# Patient Record
Sex: Male | Born: 1955 | Hispanic: No | Marital: Married | Smoking: Current every day smoker
Health system: Southern US, Community
[De-identification: ages and names within clinical notes are randomized; demographics above are authoritative.]

## PROBLEM LIST (undated history)

## (undated) DIAGNOSIS — F32A Depression, unspecified: Secondary | ICD-10-CM

## (undated) DIAGNOSIS — F419 Anxiety disorder, unspecified: Secondary | ICD-10-CM

## (undated) DIAGNOSIS — E785 Hyperlipidemia, unspecified: Secondary | ICD-10-CM

## (undated) DIAGNOSIS — J45909 Unspecified asthma, uncomplicated: Secondary | ICD-10-CM

## (undated) DIAGNOSIS — F329 Major depressive disorder, single episode, unspecified: Secondary | ICD-10-CM

## (undated) DIAGNOSIS — J189 Pneumonia, unspecified organism: Secondary | ICD-10-CM

## (undated) DIAGNOSIS — F319 Bipolar disorder, unspecified: Secondary | ICD-10-CM

## (undated) DIAGNOSIS — N529 Male erectile dysfunction, unspecified: Secondary | ICD-10-CM

## (undated) DIAGNOSIS — F1010 Alcohol abuse, uncomplicated: Secondary | ICD-10-CM

## (undated) HISTORY — DX: Hyperlipidemia, unspecified: E78.5

## (undated) HISTORY — DX: Alcohol abuse, uncomplicated: F10.10

## (undated) HISTORY — DX: Depression, unspecified: F32.A

## (undated) HISTORY — DX: Major depressive disorder, single episode, unspecified: F32.9

## (undated) HISTORY — DX: Unspecified asthma, uncomplicated: J45.909

## (undated) HISTORY — DX: Anxiety disorder, unspecified: F41.9

## (undated) HISTORY — DX: Male erectile dysfunction, unspecified: N52.9

## (undated) HISTORY — DX: Bipolar disorder, unspecified: F31.9

## (undated) HISTORY — PX: TONSILLECTOMY: SUR1361

## (undated) HISTORY — PX: COLONOSCOPY: SHX174

## (undated) HISTORY — DX: Pneumonia, unspecified organism: J18.9

---

## 2010-03-08 ENCOUNTER — Encounter: Payer: Self-pay | Admitting: Internal Medicine

## 2010-03-16 ENCOUNTER — Ambulatory Visit
Admission: RE | Admit: 2010-03-16 | Discharge: 2010-03-16 | Payer: Self-pay | Source: Home / Self Care | Attending: Internal Medicine | Admitting: Internal Medicine

## 2010-03-16 DIAGNOSIS — J449 Chronic obstructive pulmonary disease, unspecified: Secondary | ICD-10-CM | POA: Insufficient documentation

## 2010-03-16 DIAGNOSIS — F17210 Nicotine dependence, cigarettes, uncomplicated: Secondary | ICD-10-CM | POA: Insufficient documentation

## 2010-04-07 NOTE — Assessment & Plan Note (Signed)
Summary: Pulmonary/ new pt eval with cb/copd 75% HFA    CC:  Pulmonary Consult, increase sob worse with exertion , prod cough green, and symptons worse over the past month.  History of Present Illness: 55 yowm active smoker with new onset chronic cough / sob x 2009    March 16, 2010  1st pulmonary office eval for acute on chronic cough and sob went to [facility]  03/08/10 with cxr and rx with doxy and neb some better with wife's symbicort last good day all the way back in late November  with doe x trash can to road,   does ok flat surface ,  does ok on steps if he takes his time but can't get in a rush. Assoc with nasal congestion and mild chest tightness with cough, better with inhalers   Pt denies any significant sore throat, dysphagia, itching, sneezing,  nasal congestion or excess secretions,  fever, chills, sweats, unintended wt loss, typical pleuritic or exertional cp, hempoptysis, change in activity tolerance  orthopnea pnd or leg swelling Pt also denies any obvious fluctuation in symptoms with weather or environmental change or other alleviating or aggravating factors.       Preventive Screening-Counseling & Management  Alcohol-Tobacco     Smoking Status: current     Packs/Day: 1.5     Year Started: 1961  Current Medications (verified): 1)  Lamotrigine 25 Mg Tabs (Lamotrigine) .Marland Kitchen.. 1 Once Daily 2)  Doxycycline Hyclate 100 Mg Caps (Doxycycline Hyclate) .Marland Kitchen.. 1 Twice A Day  Allergies (verified): 1)  ! 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  The patient denies non-productive cough, coughing up blood, irregular heartbeats, acid heartburn, indigestion, loss of appetite, weight change, abdominal pain, difficulty swallowing, sore throat, itching, ear ache, hand/feet swelling, rash, and fever.    Vital Signs:  Patient profile:   55 year old male Height:      69 inches Weight:      179.2 pounds BMI:     26.56 O2 Sat:      97 % on Room air Temp:     97 degrees F oral Pulse rate:   96 / minute BP sitting:   120 / 90  (left arm)  Vitals Entered By: Renold Genta RCP, LPN (March 16, 2010 2:13 PM)  O2 Flow:  Room air CC: Pulmonary Consult, increase sob worse with exertion , prod cough green, symptons worse over the past month Comments Medications reviewed with patient Renold Genta RCP, LPN  March 16, 2010 2:22 PM    Physical Exam  Additional Exam:  amb wm with mod congested sounding cough wt 179 March 18, 2010 HEENT mild turbinate edema.  Oropharynx no thrush or excess pnd or cobblestoning.  No JVD or cervical adenopathy. Mild accessory muscle hypertrophy. Trachea midline, nl thryroid. Chest was hyperinflated by percussion with diminished  breath sounds and moderate increased exp time without wheeze. Hoover sign positive at mid inspiration. Regular rate and rhythm without murmur gallop or rub or increase P2 or edema.  Abd: no hsm, nl excursion. Ext warm without cyanosis or clubbing.     CXR  Procedure date:  03/08/2010  Findings:      No acute intrathroacic abnormality per [facility]  Impression & Recommendations:  Problem # 1:  COPD UNSPECIFIED (ICD-496) relatively mild clinically though still activly smoking    DDX of  difficult airways managment all start with A and  include Adherence, Ace  Inhibitors, Acid Reflux, Active Sinus Disease, Alpha 1 Antitripsin deficiency, Anxiety masquerading as Airways dz,  ABPA,  allergy(esp in young), Aspiration (esp in elderly), Adverse effects of DPI,  Active smokers, plus one B  = Beta blocker use.Marland Kitchen    Active smoking greatest concern here, see #2  Adherence: I spent extra time with the patient today explaining optimal mdi  technique.  This improved from  50-75% effective  Problem # 2:  SMOKER (ICD-305.1)  I emphasized that although we never turn away smokers from the pulmonary clinic, we do ask that they understand that the recommendations that were made won't work nearly as well in the presence of continued cigarette exposure and we may reach a point where we can't help the patient if he/she can't quit smoking.    Medications Added to Medication List This Visit: 1)  Lamotrigine 25 Mg Tabs (Lamotrigine) .Marland Kitchen.. 1 once daily 2)  Doxycycline Hyclate 100 Mg Caps (Doxycycline hyclate) .Marland Kitchen.. 1 twice a day 3)  Symbicort 160-4.5 Mcg/act Aero (Budesonide-formoterol fumarate) .... 2 puffs first thing  in am and 2 puffs again in pm about 12 hours later  Patient Instructions: 1)  Symbicort 160 2 puffs first thing  in am and 2 puffs again in pm about 12 hours later  2)  Stopping smoking is more important aspect of your care 3)  Please schedule a follow-up appointment in 6 weeks, sooner if needed with pfts  Prescriptions: SYMBICORT 160-4.5 MCG/ACT  AERO (BUDESONIDE-FORMOTEROL FUMARATE) 2 puffs first thing  in am and 2 puffs again in pm about 12 hours later  #1 x 11   Entered and Authorized by:   Nyoka Cowden MD   Signed by:   Nyoka Cowden MD on 03/16/2010   Method used:   Electronically to        [facility] [phone]* (retail)       [address]       [place], [place]  [address]       Ph: [phone] or [phone]       Fax: [phone]   RxID:   [id number]   Appended Document: Orders Update    Clinical  Lists Changes  Orders: Added new Service order of New Patient Level V (36644) - Signed Added new Service order of HFA Instruction [phone]) - Signed

## 2010-04-28 ENCOUNTER — Ambulatory Visit: Payer: Self-pay | Admitting: Internal Medicine

## 2010-04-29 ENCOUNTER — Telehealth: Payer: Self-pay | Admitting: Internal Medicine

## 2010-05-03 NOTE — Progress Notes (Signed)
Summary: nos appt  Phone Note Call from Patient   Caller: [email or web address]  Call For: Benna Arno Summary of Call: LMTCB x2 to [facility] nos from 2/23. Initial call taken by: Darletta Moll,  April 29, 2010 2:26 PM

## 2013-04-21 ENCOUNTER — Emergency Department: Payer: Self-pay | Admitting: Emergency Medicine

## 2016-12-22 ENCOUNTER — Other Ambulatory Visit: Payer: Self-pay | Admitting: Nurse Practitioner

## 2016-12-22 ENCOUNTER — Ambulatory Visit
Admission: RE | Admit: 2016-12-22 | Discharge: 2016-12-22 | Disposition: A | Discharge status: Home / Self Care | Payer: 59 | Source: Ambulatory Visit | Attending: Nurse Practitioner | Admitting: Nurse Practitioner

## 2016-12-22 DIAGNOSIS — J449 Chronic obstructive pulmonary disease, unspecified: Secondary | ICD-10-CM

## 2016-12-22 DIAGNOSIS — R05 Cough: Secondary | ICD-10-CM

## 2016-12-22 DIAGNOSIS — R634 Abnormal weight loss: Secondary | ICD-10-CM

## 2016-12-22 DIAGNOSIS — R059 Cough, unspecified: Secondary | ICD-10-CM

## 2017-03-19 ENCOUNTER — Other Ambulatory Visit: Payer: Self-pay

## 2017-03-19 ENCOUNTER — Inpatient Hospital Stay
Admission: EM | Admit: 2017-03-19 | Discharge: 2017-03-21 | DRG: 871 | Disposition: A | Discharge status: Home / Self Care | Payer: 59 | Attending: Internal Medicine | Admitting: Internal Medicine

## 2017-03-19 ENCOUNTER — Encounter: Payer: Self-pay | Admitting: Emergency Medicine

## 2017-03-19 ENCOUNTER — Emergency Department: Payer: 59

## 2017-03-19 DIAGNOSIS — J440 Chronic obstructive pulmonary disease with acute lower respiratory infection: Secondary | ICD-10-CM | POA: Diagnosis present

## 2017-03-19 DIAGNOSIS — F1010 Alcohol abuse, uncomplicated: Secondary | ICD-10-CM | POA: Diagnosis present

## 2017-03-19 DIAGNOSIS — Z79899 Other long term (current) drug therapy: Secondary | ICD-10-CM | POA: Diagnosis not present

## 2017-03-19 DIAGNOSIS — J189 Pneumonia, unspecified organism: Secondary | ICD-10-CM

## 2017-03-19 DIAGNOSIS — F419 Anxiety disorder, unspecified: Secondary | ICD-10-CM | POA: Diagnosis present

## 2017-03-19 DIAGNOSIS — F319 Bipolar disorder, unspecified: Secondary | ICD-10-CM | POA: Diagnosis present

## 2017-03-19 DIAGNOSIS — F17210 Nicotine dependence, cigarettes, uncomplicated: Secondary | ICD-10-CM | POA: Diagnosis present

## 2017-03-19 DIAGNOSIS — R911 Solitary pulmonary nodule: Secondary | ICD-10-CM | POA: Diagnosis present

## 2017-03-19 DIAGNOSIS — E876 Hypokalemia: Secondary | ICD-10-CM | POA: Diagnosis present

## 2017-03-19 DIAGNOSIS — A419 Sepsis, unspecified organism: Principal | ICD-10-CM | POA: Diagnosis present

## 2017-03-19 DIAGNOSIS — E871 Hypo-osmolality and hyponatremia: Secondary | ICD-10-CM | POA: Diagnosis present

## 2017-03-19 DIAGNOSIS — E785 Hyperlipidemia, unspecified: Secondary | ICD-10-CM | POA: Diagnosis present

## 2017-03-19 DIAGNOSIS — J181 Lobar pneumonia, unspecified organism: Secondary | ICD-10-CM | POA: Diagnosis present

## 2017-03-19 LAB — BASIC METABOLIC PANEL
Anion gap: 12 (ref 5–15)
BUN: 16 mg/dL (ref 6–20)
CHLORIDE: 93 mmol/L — AB (ref 101–111)
CO2: 23 mmol/L (ref 22–32)
CREATININE: 1.13 mg/dL (ref 0.61–1.24)
Calcium: 8.5 mg/dL — ABNORMAL LOW (ref 8.9–10.3)
GFR calc Af Amer: >60 mL/min (ref >60)
GFR calc non Af Amer: >60 mL/min (ref >60)
Glucose, Bld: 138 mg/dL — ABNORMAL HIGH (ref 65–99)
Potassium: 3.4 mmol/L — ABNORMAL LOW (ref 3.5–5.1)
SODIUM: 128 mmol/L — AB (ref 135–145)

## 2017-03-19 LAB — CBC
HCT: 37.7 % — ABNORMAL LOW (ref 40.0–52.0)
Hemoglobin: 13.1 g/dL (ref 13.0–18.0)
MCH: 32.8 pg (ref 26.0–34.0)
MCHC: 34.8 g/dL (ref 32.0–36.0)
MCV: 94.2 fL (ref 80.0–100.0)
PLATELETS: 243 10*3/uL (ref 150–440)
RBC: 4 MIL/uL — ABNORMAL LOW (ref 4.40–5.90)
RDW: 14.1 % (ref 11.5–14.5)
WBC: 11.3 10*3/uL — ABNORMAL HIGH (ref 3.8–10.6)

## 2017-03-19 LAB — LACTIC ACID, PLASMA
LACTIC ACID, VENOUS: 1.7 mmol/L (ref 0.5–1.9)
LACTIC ACID, VENOUS: 1.7 mmol/L (ref 0.5–1.9)
LACTIC ACID, VENOUS: 1.2 mmol/L (ref 0.5–1.9)

## 2017-03-19 LAB — PROTIME-INR
INR: 1.29
Prothrombin Time: 16 seconds — ABNORMAL HIGH (ref 11.4–15.2)

## 2017-03-19 LAB — APTT: aPTT: 42 seconds — ABNORMAL HIGH (ref 24–36)

## 2017-03-19 LAB — MAGNESIUM: MAGNESIUM: 2.2 mg/dL (ref 1.7–2.4)

## 2017-03-19 LAB — INFLUENZA PANEL BY PCR (TYPE A & B)
Influenza A By PCR: NEGATIVE
Influenza B By PCR: NEGATIVE

## 2017-03-19 LAB — PROCALCITONIN: PROCALCITONIN: 6.14 ng/mL

## 2017-03-19 LAB — TROPONIN I: Troponin I: <0.03 ng/mL (ref <0.03)

## 2017-03-19 MED ORDER — MORPHINE SULFATE (PF) 4 MG/ML IV SOLN
4.0000 mg | Freq: Once | INTRAVENOUS | Status: AC
  Administered 2017-03-19: 4 mg via INTRAVENOUS
  Filled 2017-03-19: qty 1

## 2017-03-19 MED ORDER — LORAZEPAM 2 MG/ML IJ SOLN: 1.0000 mg | Freq: Four times a day (QID) | INTRAMUSCULAR | Status: DC | PRN

## 2017-03-19 MED ORDER — BISACODYL 5 MG PO TBEC: 5.0000 mg | DELAYED_RELEASE_TABLET | Freq: Every day | ORAL | Status: DC | PRN

## 2017-03-19 MED ORDER — SODIUM CHLORIDE 0.9% FLUSH
3.0000 mL | Freq: Two times a day (BID) | INTRAVENOUS | Status: DC
  Administered 2017-03-19 – 2017-03-21 (×4): 3 mL via INTRAVENOUS

## 2017-03-19 MED ORDER — IOPAMIDOL (ISOVUE-370) INJECTION 76%
75.0000 mL | Freq: Once | INTRAVENOUS | Status: AC | PRN
  Administered 2017-03-19: 75 mL via INTRAVENOUS

## 2017-03-19 MED ORDER — ALBUTEROL SULFATE (2.5 MG/3ML) 0.083% IN NEBU
2.5000 mg | INHALATION_SOLUTION | Freq: Once | RESPIRATORY_TRACT | Status: AC
  Administered 2017-03-19: 2.5 mg via RESPIRATORY_TRACT
  Filled 2017-03-19: qty 3

## 2017-03-19 MED ORDER — SODIUM CHLORIDE 0.9 % IV BOLUS (SEPSIS)
1000.0000 mL | Freq: Once | INTRAVENOUS | Status: AC
  Administered 2017-03-19: 1000 mL via INTRAVENOUS

## 2017-03-19 MED ORDER — ENOXAPARIN SODIUM 40 MG/0.4ML ~~LOC~~ SOLN
40.0000 mg | SUBCUTANEOUS | Status: DC
  Administered 2017-03-19 – 2017-03-20 (×2): 40 mg via SUBCUTANEOUS
  Filled 2017-03-19 (×2): qty 0.4

## 2017-03-19 MED ORDER — SODIUM CHLORIDE 0.9% FLUSH: 3.0000 mL | INTRAVENOUS | Status: DC | PRN

## 2017-03-19 MED ORDER — LAMOTRIGINE 100 MG PO TABS
100.0000 mg | ORAL_TABLET | Freq: Every day | ORAL | Status: DC
  Administered 2017-03-19 – 2017-03-20 (×2): 100 mg via ORAL
  Filled 2017-03-19 (×2): qty 1

## 2017-03-19 MED ORDER — DEXTROSE 5 % IV SOLN
500.0000 mg | Freq: Every day | INTRAVENOUS | Status: DC
  Filled 2017-03-19: qty 500

## 2017-03-19 MED ORDER — DEXTROSE 5 % IV SOLN
1.0000 g | Freq: Every day | INTRAVENOUS | Status: DC
  Filled 2017-03-19: qty 10

## 2017-03-19 MED ORDER — ONDANSETRON HCL 4 MG/2ML IJ SOLN: 4.0000 mg | Freq: Four times a day (QID) | INTRAMUSCULAR | Status: DC | PRN

## 2017-03-19 MED ORDER — POTASSIUM CHLORIDE CRYS ER 20 MEQ PO TBCR
40.0000 meq | EXTENDED_RELEASE_TABLET | Freq: Once | ORAL | Status: AC
  Administered 2017-03-19: 15:00:00 40 meq via ORAL
  Filled 2017-03-19: qty 2

## 2017-03-19 MED ORDER — SODIUM CHLORIDE 0.9 % IV SOLN: 250.0000 mL | INTRAVENOUS | Status: DC | PRN

## 2017-03-19 MED ORDER — VITAMIN B-1 100 MG PO TABS
100.0000 mg | ORAL_TABLET | Freq: Every day | ORAL | Status: DC
  Administered 2017-03-19 – 2017-03-20 (×2): 100 mg via ORAL
  Filled 2017-03-19 (×2): qty 1

## 2017-03-19 MED ORDER — ALBUTEROL SULFATE (2.5 MG/3ML) 0.083% IN NEBU: 2.5000 mg | INHALATION_SOLUTION | RESPIRATORY_TRACT | Status: DC | PRN

## 2017-03-19 MED ORDER — ACETAMINOPHEN 650 MG RE SUPP: 650.0000 mg | Freq: Four times a day (QID) | RECTAL | Status: DC | PRN

## 2017-03-19 MED ORDER — CEFTRIAXONE SODIUM IN DEXTROSE 20 MG/ML IV SOLN
1.0000 g | Freq: Once | INTRAVENOUS | Status: AC
  Administered 2017-03-19: 1 g via INTRAVENOUS
  Filled 2017-03-19: qty 50

## 2017-03-19 MED ORDER — FOLIC ACID 1 MG PO TABS
1000.0000 ug | ORAL_TABLET | Freq: Every day | ORAL | Status: DC
  Administered 2017-03-19 – 2017-03-20 (×2): 1 mg via ORAL
  Filled 2017-03-19 (×2): qty 1

## 2017-03-19 MED ORDER — ONDANSETRON HCL 4 MG/2ML IJ SOLN
4.0000 mg | Freq: Once | INTRAMUSCULAR | Status: AC
  Administered 2017-03-19: 4 mg via INTRAVENOUS
  Filled 2017-03-19: qty 2

## 2017-03-19 MED ORDER — SENNOSIDES-DOCUSATE SODIUM 8.6-50 MG PO TABS: 1.0000 | ORAL_TABLET | Freq: Every evening | ORAL | Status: DC | PRN

## 2017-03-19 MED ORDER — ACETAMINOPHEN 325 MG PO TABS: 650.0000 mg | ORAL_TABLET | Freq: Four times a day (QID) | ORAL | Status: DC | PRN

## 2017-03-19 MED ORDER — GUAIFENESIN 100 MG/5ML PO SOLN
5.0000 mL | ORAL | Status: DC | PRN
  Administered 2017-03-20: 100 mg via ORAL
  Filled 2017-03-19: qty 5
  Filled 2017-03-19 (×2): qty 10
  Filled 2017-03-19: qty 5

## 2017-03-19 MED ORDER — HYDROCODONE-ACETAMINOPHEN 5-325 MG PO TABS
1.0000 | ORAL_TABLET | ORAL | Status: DC | PRN
  Administered 2017-03-19 (×2): 1 via ORAL
  Administered 2017-03-20 (×2): 2 via ORAL
  Filled 2017-03-19: qty 2
  Filled 2017-03-19: qty 1
  Filled 2017-03-19: qty 2
  Filled 2017-03-19: qty 1

## 2017-03-19 MED ORDER — NICOTINE 14 MG/24HR TD PT24: 14.0000 mg | MEDICATED_PATCH | Freq: Every day | TRANSDERMAL | Status: DC

## 2017-03-19 MED ORDER — SODIUM CHLORIDE 0.9 % IV SOLN
INTRAVENOUS | Status: DC
  Administered 2017-03-19 – 2017-03-20 (×2): via INTRAVENOUS

## 2017-03-19 MED ORDER — DEXTROSE 5 % IV SOLN
500.0000 mg | Freq: Once | INTRAVENOUS | Status: AC
  Administered 2017-03-19: 16:00:00 500 mg via INTRAVENOUS
  Filled 2017-03-19: qty 500

## 2017-03-19 MED ORDER — ONDANSETRON HCL 4 MG PO TABS: 4.0000 mg | ORAL_TABLET | Freq: Four times a day (QID) | ORAL | Status: DC | PRN

## 2017-03-19 MED ORDER — LORAZEPAM 1 MG PO TABS
1.0000 mg | ORAL_TABLET | Freq: Four times a day (QID) | ORAL | Status: DC | PRN
  Administered 2017-03-19: 1 mg via ORAL
  Filled 2017-03-19: qty 1

## 2017-03-19 MED ORDER — CEFTRIAXONE SODIUM IN DEXTROSE 20 MG/ML IV SOLN
1.0000 g | Freq: Every day | INTRAVENOUS | Status: DC
  Filled 2017-03-19: qty 50

## 2017-03-19 NOTE — ED Provider Notes (Addendum)
[facility] Emergency Department Provider Note  ____________________________________________   First MD Initiated Contact with Patient 03/19/17 [phone]     (approximate)  I have reviewed the triage vital signs and the nursing notes.   HISTORY  Chief Complaint Shortness of Breath   HPI Drew Lee is a 62 y.o. male with a history of bipolar disorder, COPD who still smokes 1 pack/day who is presenting to the emergency department with 1 week of worsening shortness of breath as well as low thoracic back pain.  He says that he was having intermittent fever night sweats but this has stopped.  However, he has now developed a productive cough and worsening left lower thoracic pain which he says worsens when he moves or when he coughs.  Says the pain is sharp and a 10 out of 10 at this time.   Past Medical History:  Diagnosis Date  . Anxiety   . Asthma   . Bipolar 1 disorder (HCC)   . Depression   . ED (erectile dysfunction)   . ETOH abuse   . Hyperlipidemia     Patient Active Problem List   Diagnosis Date Noted  . SMOKER 03/16/2010  . COPD UNSPECIFIED 03/16/2010    Past Surgical History:  Procedure Laterality Date  . COLONOSCOPY      Prior to Admission medications   Medication Sig Start Date End Date Taking? Authorizing Provider  albuterol (PROVENTIL HFA;VENTOLIN HFA) 108 (90 BASE) MCG/ACT inhaler Inhale 2 puffs into the lungs every 6 (six) hours as needed for wheezing or shortness of breath.    [provider]  lamoTRIgine (LAMICTAL) 100 MG tablet Take 100 mg by mouth daily.    [provider]  Multiple Vitamins-Minerals (MULTIVITAMIN WITH MINERALS) tablet Take 1 tablet by mouth daily.    [provider]  omega-3 acid ethyl esters (LOVAZA) 1 G capsule Take by mouth 2 (two) times daily.    [provider]  Red Yeast Rice 600 MG CAPS Take by mouth.    [provider]    Allergies Magnesium-containing  compounds; Sulfa antibiotics; Sulfonamide derivatives; and Viagra [sildenafil citrate]  Family History  Problem Relation Age of Onset  . Kidney failure Mother   . Heart attack Father     Social History Social History   Tobacco Use  . Smoking status: Current Every Day Smoker  . Smokeless tobacco: Never Used  Substance Use Topics  . Alcohol use: Yes  . Drug use: No    Review of Systems  Constitutional: No fever/chills Eyes: No visual changes. ENT: No sore throat. Cardiovascular: As above Respiratory: As above Gastrointestinal: No abdominal pain.  No nausea, no vomiting.  No diarrhea.  No constipation. Genitourinary: Negative for dysuria. Musculoskeletal: As above  skin: Negative for rash. Neurological: Negative for headaches, focal weakness or numbness.   ____________________________________________   PHYSICAL EXAM:  VITAL SIGNS: ED Triage Vitals  Enc Vitals Group     BP 03/19/17 0804 (!) 123/93     Pulse Rate 03/19/17 0804 (!) 128     Resp 03/19/17 0804 (!) 22     Temp 03/19/17 0804 99.4 F (37.4 C)     Temp Source 03/19/17 0804 Oral     SpO2 03/19/17 0804 95 %     Weight 03/19/17 0806 162 lb (73.5 kg)     Height 03/19/17 0806 5\' 9"  (1.753 m)     Head Circumference --      Peak Flow --  Pain Score 03/19/17 0806 0     Pain Loc --      Pain Edu? --      Excl. in GC? --     Constitutional: Alert and oriented.  Appears uncomfortable, tachypneic. Eyes: Conjunctivae are normal.  Head: Atraumatic. Nose: No congestion/rhinnorhea. Mouth/Throat: Mucous membranes are moist.  Neck: No stridor.   Cardiovascular: Tachycardic, regular rhythm. Grossly normal heart sounds.   Respiratory: Tachypneic and taking shallow breaths but speaking in full sentences.  Lungs are clear throughout without any wheezing.  No tenderness to palpation along the low thoracic back bilaterally. Gastrointestinal: Soft and nontender. No distention.  No CVA tenderness to  palpation. Musculoskeletal: No lower extremity tenderness nor edema.  No joint effusions. Neurologic:  Normal speech and language. No gross focal neurologic deficits are appreciated. Skin:  Skin is warm, dry and intact. No rash noted. Psychiatric: Mood and affect are normal. Speech and behavior are normal.  ____________________________________________   LABS (all labs ordered are listed, but only abnormal results are displayed)  Labs Reviewed  BASIC METABOLIC PANEL - Abnormal; Notable for the following components:      Result Value   Sodium 128 (*)    Potassium 3.4 (*)    Chloride 93 (*)    Glucose, Bld 138 (*)    Calcium 8.5 (*)    All other components within normal limits  CBC - Abnormal; Notable for the following components:   WBC 11.3 (*)    RBC 4.00 (*)    HCT 37.7 (*)    All other components within normal limits  TROPONIN I   ____________________________________________  EKG  ED ECG REPORT I, Arelia Longestavid M Schaevitz, the attending physician, personally viewed and interpreted this ECG.   Date: 03/19/2017  EKG Time: 0811  Rate: 123  Rhythm: sinus tachycardia  Axis: Normal  Intervals:none  ST&T Change: No ST segment elevation or depression.  No abnormal T wave inversion.  ____________________________________________  RADIOLOGY  Chest x-ray suspicious for left upper lobe pneumonia with possible obstruction.  Possible mass.  Recommending CT for further evaluation.  Dense left upper lobe consolidation compatible with pneumonia.  13 mm right upper lobe nodule. ____________________________________________   PROCEDURES  Procedure(s) performed:   Procedures  Critical Care performed:   ____________________________________________   INITIAL IMPRESSION / ASSESSMENT AND PLAN / ED COURSE  Pertinent labs & imaging results that were available during my care of the patient were reviewed by me and considered in my medical decision making (see chart for  details).  Differential includes, but is not limited to, viral syndrome, bronchitis including COPD exacerbation, pneumonia, reactive airway disease including asthma, CHF including exacerbation with or without pulmonary/interstitial edema, pneumothorax, ACS, thoracic trauma, and pulmonary embolism. As part of my medical decision making, I reviewed the following data within the electronic MEDICAL RECORD NUMBER Old chart reviewed   ----------------------------------------- 11:32 AM on 03/19/2017 -----------------------------------------  Patient at this time continues to be tachycardic and tachypneic.  We will start the patient on IV antibiotics.  Sepsis alert called secondary to tachycardia as well as elevated white blood cell count with a known infectious source.  Patient will be admitted to the hospital.  Signed out to Dr. Imogene Burnhen.  The patient is understanding of the plan and willing to comply.  He understands the diagnosis as well.  He is accompanied by his wife requested a work note because she is late to work because she is covering her husband.  This note was given.  Patient says  that his most recent antibiotics were a 5-day course of doxycycline 1 month ago.  He says that he had taken doxycycline initially multiple months ago but then saved the extra 5 tabs after he did not complete the first course.  I will start the patient on community-acquired regimen for his antibiotics as he has not had any recent hospitalizations.  Has not a full course of antibiotics either.     ____________________________________________   FINAL CLINICAL IMPRESSION(S) / ED DIAGNOSES  Lobar pneumonia.    NEW MEDICATIONS STARTED DURING THIS VISIT:  New Prescriptions   No medications on file     Note:  This document was prepared using Dragon voice recognition software and may include unintentional dictation errors.     Myrna Blazer, MD 03/19/17 1133    Schaevitz, Myra Rude, MD 03/19/17  1134    Schaevitz, Myra Rude, MD 03/19/17 1134

## 2017-03-19 NOTE — ED Notes (Signed)
This RN attempted to call report accepting RN with MD at this time. RN will continue monitor.

## 2017-03-19 NOTE — ED Notes (Signed)
CODE  SEPSIS  CALLED  TO  CARELINK 

## 2017-03-19 NOTE — Progress Notes (Signed)
Pharmacy Antibiotic Note  Drew Lee is a 62 y.o. male admitted on 03/19/2017 with pneumonia and sepsis.  Pharmacy has been consulted for ceftriaxone and azithromycin dosing.  Plan: CTX 1 g iv daily and azithromycin 500 mg iv daily.   Height: 5\' 9"  (175.3 cm) Weight: 162 lb (73.5 kg) IBW/kg (Calculated) : 70.7  Temp (24hrs), Avg:99.4 F (37.4 C), Min:99.4 F (37.4 C), Max:99.4 F (37.4 C)  Recent Labs  Lab 03/19/17 0810 03/19/17 1106  WBC 11.3*  --   CREATININE 1.13  --   LATICACIDVEN  --  1.7    Estimated Creatinine Clearance: 68.6 mL/min (by C-G formula based on SCr of 1.13 mg/dL).    Allergies  Allergen Reactions  . Magnesium-Containing Compounds Hives  . Sulfa Antibiotics Hives  . Sulfonamide Derivatives     REACTION: hives  . Viagra [Sildenafil Citrate]     Blurred vision    Antimicrobials this admission: CTX 1/14 >>  Azithromycin 1/14 >>   Dose adjustments this admission:   Microbiology results: 1/14 BCx: sent  Thank you for allowing pharmacy to be a part of this patient's care.  Luisa HartChristy, Katlin Bortner D 03/19/2017 12:16 PM

## 2017-03-19 NOTE — Progress Notes (Signed)
CODE SEPSIS - PHARMACY COMMUNICATION  **Broad Spectrum Antibiotics should be administered within 1 hour of Sepsis diagnosis**  Time Code Sepsis Called/Page Received: 11:37  Antibiotics Ordered: Ceftriaxone and Azithromycin  Time of 1st antibiotic administration: Ceftriaxone 1g administered at 11:28  Additional action taken by pharmacy: none  If necessary, Name of Provider/Nurse Contacted: N/A    Clovia CuffLisa Samanvitha Germany, PharmD, BCPS 03/19/2017 11:53 AM

## 2017-03-19 NOTE — ED Triage Notes (Signed)
Arrives with C/O SOB, body aches, chest pain, not feeling well x 1 week.  Seen by PCP for symptoms on Thursday and told he had a 72 hour virus.  Symptoms not improving.

## 2017-03-19 NOTE — Progress Notes (Signed)
Date: 03/19/2017,   MRN# [id number] AHMOD GILLESPIE 09-23-1955 Code Status:     Code Status Orders  (From admission, onward)        Start     Ordered   03/19/17 1319  Full code  Continuous     03/19/17 1318    Code Status History    Date Active Date Inactive Code Status Order ID Comments User Context   This patient has a current code status but no historical code status.     Hosp day:@LENGTHOFSTAYDAYS @ Referring MD: @ATDPROV @      CC: abnormal chest ct scan  HPI: this is a 62 year old male , came in with cough, sob. On w/u noted to have left pneumonia. admitted on septic protocol. He is a drinker and smoker. He also noted to have 13 mm RUL nodule for which pulmonary was consulted.  He denies hemoptysis. No frank fever or sweats. No known TB exposure.  No hx of aspiration. Marland Kitchen    PMHX:   Past Medical History:  Diagnosis Date  . Anxiety   . Asthma   . Bipolar 1 disorder (HCC)   . Depression   . ED (erectile dysfunction)   . ETOH abuse   . Hyperlipidemia    Surgical Hx:  Past Surgical History:  Procedure Laterality Date  . COLONOSCOPY     Family Hx:  Family History  Problem Relation Age of Onset  . Kidney failure Mother   . Heart attack Father    Social Hx:   Social History   Tobacco Use  . Smoking status: Current Every Day Smoker    Packs/day: 1.00  . Smokeless tobacco: Never Used  . Tobacco comment: refused  Substance Use Topics  . Alcohol use: Yes    Alcohol/week: 2.4 oz    Types: 4 Cans of beer per week    Comment: per pt tues night  . Drug use: No   Medication:    Home Medication:    Current Medication: @CURMEDTAB @   Allergies:  Magnesium-containing compounds; Sulfa antibiotics; Sulfonamide derivatives; and Viagra [sildenafil citrate]  Review of Systems: Gen:  Denies  fever, sweats, chills HEENT: Denies blurred vision, double vision, ear pain, eye pain, hearing loss, nose bleeds, sore throat Cvc:  No dizziness, chest pain or heaviness Resp:  Cough,  son  Gi: Denies swallowing difficulty, stomach pain, nausea or vomiting, diarrhea, constipation, bowel incontinence Gu:  Denies bladder incontinence, burning urine Ext:   No Joint pain, stiffness or swelling Skin: No skin rash, easy bruising or bleeding or hives Endoc:  No polyuria, polydipsia , polyphagia or weight change Psych: No depression, insomnia or hallucinations  Other:  All other systems negative  Physical Examination:   VS: BP (!) 113/91 (BP Location: Left Arm)   Pulse (!) 105   Temp 98.9 F (37.2 C) (Oral)   Resp (!) 26   Ht 5\' 9"  (1.753 m)   Wt 162 lb (73.5 kg)   SpO2 91%   BMI 23.92 kg/m   General Appearance: No distress  Neuro: without focal findings, mental status, speech normal, alert and oriented, cranial nerves 2-12 intact, reflexes normal and symmetric, sensation grossly normal  HEENT: PERRLA, EOM intact, no ptosis, no other lesions noticed NECK: Supple, no stridor Pulmonary:.No wheezing, No rales   Cardiovascular:  Normal S1,S2.  No m/r/g.  Abdominal aorta pulsation normal.    Abdomen:Benign, Soft, non-tender, No masses, hepatosplenomegaly, No lymphadenopathy Endoc: No evident thyromegaly, no signs of acromegaly or Cushing features  Skin:   warm, no rashes, no ecchymosis  Extremities: normal, no cyanosis, clubbing, no edema, warm with normal capillary refill. Other findings:   Labs results:   Recent Labs    03/19/17 0810 03/19/17 1359  HGB 13.1  --   HCT 37.7*  --   MCV 94.2  --   WBC 11.3*  --   BUN 16  --   CREATININE 1.13  --   GLUCOSE 138*  --   CALCIUM 8.5*  --   INR  --  1.29  ,    Rad results:   Dg Chest 2 View  Result Date: 03/19/2017 CLINICAL DATA:  Left-sided chest, back, and rib pain, worse with inspiration. EXAM: CHEST  2 VIEW COMPARISON:  Chest x-ray dated Aug 04, 2011. FINDINGS: The cardiomediastinal silhouette is normal in size. Normal pulmonary vascularity. Consolidation within the left upper lobe. Volume loss in the left  hemithorax with atelectasis in the left lower lobe. The right lung is clear. No pneumothorax or pleural effusion. No acute osseous abnormality. IMPRESSION: Consolidation within the left upper lobe, suspicious for pneumonia. Volume loss within the left hemithorax could reflect a postobstructive component from underlying mass. Recommend CT of the chest, with contrast if possible, for further evaluation. Electronically Signed   By: Obie Dredge M.D.   On: 03/19/2017 08:50   Ct Angio Chest Pe W And/or Wo Contrast  Result Date: 03/19/2017 CLINICAL DATA:  Shortness of breath, body aches EXAM: CT ANGIOGRAPHY CHEST WITH CONTRAST TECHNIQUE: Multidetector CT imaging of the chest was performed using the standard protocol during bolus administration of intravenous contrast. Multiplanar CT image reconstructions and MIPs were obtained to evaluate the vascular anatomy. CONTRAST:  75mL ISOVUE-370 IOPAMIDOL (ISOVUE-370) INJECTION 76% COMPARISON:  None. FINDINGS: Cardiovascular: No filling defects in the pulmonary arteries to suggest pulmonary emboli. Insert Heart Mediastinum/Nodes: Borderline sized mediastinal lymph nodes, likely reactive. Index prevascular lymph node has a short axis diameter of 9 mm. No axillary adenopathy or visible hilar adenopathy. Lungs/Pleura: Dense consolidation noted in the left upper lobe compatible with pneumonia. Moderate underlying centrilobular and paraseptal emphysema. Ground-glass nodular density in the right upper lobe measures 13 mm. Trace left pleural effusion. Upper Abdomen: Imaging into the upper abdomen shows no acute findings. Musculoskeletal: Chest wall soft tissues are unremarkable. No acute bony abnormality. Review of the MIP images confirms the above findings. IMPRESSION: Dense consolidation throughout much of the left upper lobe compatible with pneumonia. Moderate underlying centrilobular and paraseptal emphysema. 13 mm right upper lobe pulmonary nodule. Initial follow-up with CT  at 6-12 months is recommended to confirm persistence. If persistent, repeat CT is recommended every 2 years until 5 years of stability has been established. This recommendation follows the consensus statement: Guidelines for Management of Incidental Pulmonary Nodules Detected on CT Images: From the Fleischner Society 2017; Radiology 2017; 284:228-243. No evidence of pulmonary embolus. Electronically Signed   By: Charlett Nose M.D.   On: 03/19/2017 10:26        Assessment and Plan: Left upper lobe consolidation, c/w pneumonia Present antibiotic regimen is appropriate for initial coverage  Copd/emphysema Duo neb Stop smoking  13 mm RUL nodule, ? Age  Out patient w/u, determine resolution of the pneumonia Follow up in pulmonary  Pet scan after he get over the pneumonia Tissue dx per above  Etoh abuse  On dt prevention protocol  I have personally obtained a history, examined the patient, evaluated laboratory and imaging results, formulated the assessment and plan and placed orders.  The Patient requires high complexity decision making for assessment and support, frequent evaluation and titration of therapies, application of advanced monitoring technologies and extensive interpretation of multiple databases.   Cherry Wittwer,M.D. Pulmonary & Critical care Medicine [facility]

## 2017-03-19 NOTE — ED Notes (Signed)
[facility] will [facility].

## 2017-03-19 NOTE — Plan of Care (Signed)
  Progressing Activity: Ability to tolerate increased activity will improve 03/19/2017 1602 - Progressing by Luretha MurphyMiles, Samah Lapiana, RN Clinical Measurements: Ability to maintain a body temperature in the normal range will improve 03/19/2017 1602 - Progressing by Luretha MurphyMiles, Tikesha Mort, RN Respiratory: Ability to maintain adequate ventilation will improve 03/19/2017 1602 - Progressing by Luretha MurphyMiles, Tagen Milby, RN Ability to maintain a clear airway will improve 03/19/2017 1602 - Progressing by Luretha MurphyMiles, Asna Muldrow, RN Education: Knowledge of General Education information will improve 03/19/2017 1602 - Progressing by Luretha MurphyMiles, Kameran Mcneese, RN Health Behavior/Discharge Planning: Ability to manage health-related needs will improve 03/19/2017 1602 - Progressing by Luretha MurphyMiles, Rontavious Albright, RN Clinical Measurements: Ability to maintain clinical measurements within normal limits will improve 03/19/2017 1602 - Progressing by Luretha MurphyMiles, Anu Stagner, RN Will remain free from infection 03/19/2017 1602 - Progressing by Luretha MurphyMiles, Gizzelle Lacomb, RN Diagnostic test results will improve 03/19/2017 1602 - Progressing by Luretha MurphyMiles, Lavel Rieman, RN Respiratory complications will improve 03/19/2017 1602 - Progressing by Luretha MurphyMiles, Militza Devery, RN Nutrition: Adequate nutrition will be maintained 03/19/2017 1602 - Progressing by Luretha MurphyMiles, Gulianna Hornsby, RN Coping: Level of anxiety will decrease 03/19/2017 1602 - Progressing by Luretha MurphyMiles, Marjean Imperato, RN Pain Managment: General experience of comfort will improve 03/19/2017 1602 - Progressing by Luretha MurphyMiles, Aryani Daffern, RN Safety: Ability to remain free from injury will improve 03/19/2017 1602 - Progressing by Luretha MurphyMiles, Beata Beason, RN Skin Integrity: Risk for impaired skin integrity will decrease 03/19/2017 1602 - Progressing by Luretha MurphyMiles, Jaclynne Baldo, RN

## 2017-03-19 NOTE — H&P (Signed)
[employer] -  at [facility]   PATIENT NAME: Drew Lee    MR#:  [id number]  DATE OF BIRTH:  04/07/1955  DATE OF ADMISSION:  03/19/2017  PRIMARY CARE PHYSICIAN: Lance Bosch, NP   REQUESTING/REFERRING PHYSICIAN: Myrna Blazer, MD  CHIEF COMPLAINT:   Chief Complaint  Patient presents with  . Shortness of Breath   Fever, chills, cough and shortness breath for 4-5 days. HISTORY OF PRESENT ILLNESS:  Drew Lee  is a 62 y.o. male with a known history of asthma/COPD, hyperlipidemia, anxiety, depression and tobacco and alcohol abuse.  The patient presents the ED with above chief complaints.  The patient also complains of chest pain on deep breathing.  He was found tachycardia, tachypnea and leukocytosis in the ED.  Chest x-ray showed left upper lobe consolidation.  He is treated with Zithromax and Rocephin in the ED.  ED physician start sepsis protocol.  PAST MEDICAL HISTORY:   Past Medical History:  Diagnosis Date  . Anxiety   . Asthma   . Bipolar 1 disorder (HCC)   . Depression   . ED (erectile dysfunction)   . ETOH abuse   . Hyperlipidemia     PAST SURGICAL HISTORY:   Past Surgical History:  Procedure Laterality Date  . COLONOSCOPY      SOCIAL HISTORY:   Social History   Tobacco Use  . Smoking status: Current Every Day Smoker  . Smokeless tobacco: Never Used  Substance Use Topics  . Alcohol use: Yes    FAMILY HISTORY:   Family History  Problem Relation Age of Onset  . Kidney failure Mother   . Heart attack Father     DRUG ALLERGIES:   Allergies  Allergen Reactions  . Magnesium-Containing Compounds Hives  . Sulfa Antibiotics Hives  . Sulfonamide Derivatives     REACTION: hives  . Viagra [Sildenafil Citrate]     Blurred vision    REVIEW OF SYSTEMS:   Review of Systems  Constitutional: Positive for chills, fever and malaise/fatigue.  HENT: Negative for sore throat.   Eyes: Negative for blurred vision and double  vision.  Respiratory: Positive for cough and shortness of breath. Negative for hemoptysis, sputum production, wheezing and stridor.   Cardiovascular: Negative for chest pain, palpitations, orthopnea and leg swelling.  Gastrointestinal: Negative for abdominal pain, blood in stool, diarrhea, melena, nausea and vomiting.  Genitourinary: Negative for dysuria, flank pain and hematuria.  Musculoskeletal: Negative for back pain and joint pain.  Skin: Negative for rash.  Neurological: Positive for weakness. Negative for dizziness, sensory change, focal weakness, seizures, loss of consciousness and headaches.  Endo/Heme/Allergies: Negative for polydipsia.  Psychiatric/Behavioral: Negative for depression. The patient is not nervous/anxious.     MEDICATIONS AT HOME:   Prior to Admission medications   Medication Sig Start Date End Date Taking? Authorizing Provider  albuterol (PROVENTIL HFA;VENTOLIN HFA) 108 (90 BASE) MCG/ACT inhaler Inhale 2 puffs into the lungs every 6 (six) hours as needed for wheezing or shortness of breath.   Yes [provider]  folic acid (FOLVITE) 800 MCG tablet Take 800 mcg by mouth daily.   Yes [provider]  Janett Billow, Camillia sinensis, (GREEN TEA EXTRACT) 150 MG CAPS Take 1 capsule by mouth daily.   Yes [provider]  lamoTRIgine (LAMICTAL) 100 MG tablet Take 100 mg by mouth daily.   Yes [provider]  Multiple Vitamins-Minerals (MULTIVITAMIN WITH MINERALS) tablet Take 1 tablet by mouth daily.   Yes [provider]  Omega 3 1000 MG CAPS Take 1,000 mg by mouth 2 (two) times daily.   Yes [provider]  Red Yeast Rice 600 MG CAPS Take by mouth.   Yes [provider]  Thiamine HCl (VITAMIN B-1) 250 MG tablet Take 250 mg by mouth daily.   Yes [provider]      VITAL SIGNS:  Blood pressure 132/77, pulse (!) 110, temperature 99.4 F (37.4 C), temperature source Oral, resp. rate (!) 23, height 5'  9" (1.753 m), weight 162 lb (73.5 kg), SpO2 95 %.  PHYSICAL EXAMINATION:  Physical Exam  GENERAL:  62 y.o.-year-old patient lying in the bed with no acute distress.  EYES: Pupils equal, round, reactive to light and accommodation. No scleral icterus. Extraocular muscles intact.  HEENT: Head atraumatic, normocephalic. Oropharynx and nasopharynx clear.  NECK:  Supple, no jugular venous distention. No thyroid enlargement, no tenderness.  LUNGS: Diminished breath sounds bilaterally, no wheezing, rales,rhonchi or crepitation. No use of accessory muscles of respiration.  CARDIOVASCULAR: S1, S2 tachycardia. No murmurs, rubs, or gallops.  ABDOMEN: Soft, nontender, nondistended. Bowel sounds present. No organomegaly or mass.  EXTREMITIES: No pedal edema, cyanosis, or clubbing.  NEUROLOGIC: Cranial nerves II through XII are intact. Muscle strength 5/5 in all extremities. Sensation intact. Gait not checked.  PSYCHIATRIC: The patient is alert and oriented x 3.  SKIN: No obvious rash, lesion, or ulcer.   LABORATORY PANEL:   CBC Recent Labs  Lab 03/19/17 0810  WBC 11.3*  HGB 13.1  HCT 37.7*  PLT 243   ------------------------------------------------------------------------------------------------------------------  Chemistries  Recent Labs  Lab 03/19/17 0810  NA 128*  K 3.4*  CL 93*  CO2 23  GLUCOSE 138*  BUN 16  CREATININE 1.13  CALCIUM 8.5*   ------------------------------------------------------------------------------------------------------------------  Cardiac Enzymes Recent Labs  Lab 03/19/17 0810  TROPONINI <0.03   ------------------------------------------------------------------------------------------------------------------  RADIOLOGY:  Dg Chest 2 View  Result Date: 03/19/2017 CLINICAL DATA:  Left-sided chest, back, and rib pain, worse with inspiration. EXAM: CHEST  2 VIEW COMPARISON:  Chest x-ray dated Aug 04, 2011. FINDINGS: The cardiomediastinal silhouette is  normal in size. Normal pulmonary vascularity. Consolidation within the left upper lobe. Volume loss in the left hemithorax with atelectasis in the left lower lobe. The right lung is clear. No pneumothorax or pleural effusion. No acute osseous abnormality. IMPRESSION: Consolidation within the left upper lobe, suspicious for pneumonia. Volume loss within the left hemithorax could reflect a postobstructive component from underlying mass. Recommend CT of the chest, with contrast if possible, for further evaluation. Electronically Signed   By: Obie DredgeWilliam T Derry M.D.   On: 03/19/2017 08:50   Ct Angio Chest Pe W And/or Wo Contrast  Result Date: 03/19/2017 CLINICAL DATA:  Shortness of breath, body aches EXAM: CT ANGIOGRAPHY CHEST WITH CONTRAST TECHNIQUE: Multidetector CT imaging of the chest was performed using the standard protocol during bolus administration of intravenous contrast. Multiplanar CT image reconstructions and MIPs were obtained to evaluate the vascular anatomy. CONTRAST:  75mL ISOVUE-370 IOPAMIDOL (ISOVUE-370) INJECTION 76% COMPARISON:  None. FINDINGS: Cardiovascular: No filling defects in the pulmonary arteries to suggest pulmonary emboli. Insert Heart Mediastinum/Nodes: Borderline sized mediastinal lymph nodes, likely reactive. Index prevascular lymph node has a short axis diameter of 9 mm. No axillary adenopathy or visible hilar adenopathy. Lungs/Pleura: Dense consolidation noted in the left upper lobe compatible with pneumonia. Moderate underlying centrilobular and paraseptal emphysema. Ground-glass nodular density in the right upper lobe measures 13 mm. Trace left pleural effusion. Upper  Abdomen: Imaging into the upper abdomen shows no acute findings. Musculoskeletal: Chest wall soft tissues are unremarkable. No acute bony abnormality. Review of the MIP images confirms the above findings. IMPRESSION: Dense consolidation throughout much of the left upper lobe compatible with pneumonia. Moderate  underlying centrilobular and paraseptal emphysema. 13 mm right upper lobe pulmonary nodule. Initial follow-up with CT at 6-12 months is recommended to confirm persistence. If persistent, repeat CT is recommended every 2 years until 5 years of stability has been established. This recommendation follows the consensus statement: Guidelines for Management of Incidental Pulmonary Nodules Detected on CT Images: From the Fleischner Society 2017; Radiology 2017; 284:228-243. No evidence of pulmonary embolus. Electronically Signed   By: Charlett Nose M.D.   On: 03/19/2017 10:26      IMPRESSION AND PLAN:   Sepsis due to pneumonia. The patient will be admitted to medical floor.  Continue Zithromax and Rocephin.  Continue sepsis protocol, follow-up CBC and cultures.  Robitussin as needed.  Hyponatremia.  Cardiac etiology, possible related to COPD. Start normal saline IV and follow-up BMP.  Hypokalemia.  Give potassium supplement and follow-up potassium level and magnesium level.  Pulmonary nodule on the right side, pulmonary consult. COPD.  Stable.  Continue nebulizer as needed. Tobacco abuse.  Smoking cessation was counseled for 3-4 minutes, nicotine patch. Alcohol abuse.  Start CIWA protocol.  All the records are reviewed and case discussed with ED provider. Management plans discussed with the patient, his wife and they are in agreement.  CODE STATUS: Full code  TOTAL TIME TAKING CARE OF THIS PATIENT: 56 minutes.    Shaune Pollack M.D on 03/19/2017 at 12:00 PM  Between 7am to 6pm - Pager - [phone]  After 6pm go to [email or web address] - Social research officer, government  [facility] [place] [facility]  Office  [phone]  CC: Primary care physician; Lance Bosch, NP   Note: This dictation was prepared with Dragon dictation along with smaller phrase technology. Any transcriptional errors that result from this process are unin

## 2017-03-20 LAB — BLOOD CULTURE ID PANEL (REFLEXED)
Acinetobacter baumannii: NOT DETECTED
CANDIDA TROPICALIS: NOT DETECTED
Candida albicans: NOT DETECTED
Candida glabrata: NOT DETECTED
Candida krusei: NOT DETECTED
Candida parapsilosis: NOT DETECTED
Enterobacter cloacae complex: NOT DETECTED
Enterobacteriaceae species: NOT DETECTED
Enterococcus species: NOT DETECTED
Escherichia coli: NOT DETECTED
HAEMOPHILUS INFLUENZAE: NOT DETECTED
KLEBSIELLA PNEUMONIAE: NOT DETECTED
Klebsiella oxytoca: NOT DETECTED
Listeria monocytogenes: NOT DETECTED
NEISSERIA MENINGITIDIS: NOT DETECTED
PROTEUS SPECIES: NOT DETECTED
PSEUDOMONAS AERUGINOSA: NOT DETECTED
SERRATIA MARCESCENS: NOT DETECTED
STAPHYLOCOCCUS AUREUS BCID: NOT DETECTED
STAPHYLOCOCCUS SPECIES: NOT DETECTED
STREPTOCOCCUS AGALACTIAE: NOT DETECTED
STREPTOCOCCUS PNEUMONIAE: DETECTED — AB
Streptococcus pyogenes: NOT DETECTED
Streptococcus species: DETECTED — AB

## 2017-03-20 LAB — CBC
HCT: 32.9 % — ABNORMAL LOW (ref 40.0–52.0)
Hemoglobin: 11.3 g/dL — ABNORMAL LOW (ref 13.0–18.0)
MCH: 32.8 pg (ref 26.0–34.0)
MCHC: 34.3 g/dL (ref 32.0–36.0)
MCV: 95.7 fL (ref 80.0–100.0)
PLATELETS: 256 10*3/uL (ref 150–440)
RBC: 3.43 MIL/uL — ABNORMAL LOW (ref 4.40–5.90)
RDW: 14.2 % (ref 11.5–14.5)
WBC: 14.4 10*3/uL — AB (ref 3.8–10.6)

## 2017-03-20 LAB — BASIC METABOLIC PANEL
Anion gap: 10 (ref 5–15)
BUN: 19 mg/dL (ref 6–20)
CHLORIDE: 101 mmol/L (ref 101–111)
CO2: 22 mmol/L (ref 22–32)
CREATININE: 0.81 mg/dL (ref 0.61–1.24)
Calcium: 7.8 mg/dL — ABNORMAL LOW (ref 8.9–10.3)
GFR calc Af Amer: >60 mL/min (ref >60)
GFR calc non Af Amer: >60 mL/min (ref >60)
Glucose, Bld: 100 mg/dL — ABNORMAL HIGH (ref 65–99)
Potassium: 3.3 mmol/L — ABNORMAL LOW (ref 3.5–5.1)
SODIUM: 133 mmol/L — AB (ref 135–145)

## 2017-03-20 LAB — HIV ANTIBODY (ROUTINE TESTING W REFLEX): HIV Screen 4th Generation wRfx: NONREACTIVE

## 2017-03-20 MED ORDER — DEXTROSE 5 % IV SOLN
2.0000 g | INTRAVENOUS | Status: DC
  Administered 2017-03-20 – 2017-03-21 (×2): 2 g via INTRAVENOUS
  Filled 2017-03-20 (×2): qty 2

## 2017-03-20 MED ORDER — PREDNISONE 50 MG PO TABS
50.0000 mg | ORAL_TABLET | Freq: Every day | ORAL | Status: DC
  Administered 2017-03-20 – 2017-03-21 (×2): 50 mg via ORAL
  Filled 2017-03-20 (×2): qty 1

## 2017-03-20 MED ORDER — POTASSIUM CHLORIDE CRYS ER 20 MEQ PO TBCR
40.0000 meq | EXTENDED_RELEASE_TABLET | ORAL | Status: AC
  Administered 2017-03-20 (×2): 40 meq via ORAL
  Filled 2017-03-20 (×2): qty 2

## 2017-03-20 NOTE — Progress Notes (Signed)
[employer] - Trainer at [facility]   PATIENT NAME: Drew Lee    MR#:  [id number]  DATE OF BIRTH:  1955-10-20  SUBJECTIVE:  CHIEF COMPLAINT:   Chief Complaint  Patient presents with  . Shortness of Breath   Continues to have shortness of breath and wheezing.   Afebrile.  REVIEW OF SYSTEMS:    Review of Systems  Constitutional: Positive for malaise/fatigue. Negative for chills and fever.  HENT: Negative for sore throat.   Eyes: Negative for blurred vision, double vision and pain.  Respiratory: Positive for cough, shortness of breath and wheezing. Negative for hemoptysis.   Cardiovascular: Negative for chest pain, palpitations, orthopnea and leg swelling.  Gastrointestinal: Negative for abdominal pain, constipation, diarrhea, heartburn, nausea and vomiting.  Genitourinary: Negative for dysuria and hematuria.  Musculoskeletal: Negative for back pain and joint pain.  Skin: Negative for rash.  Neurological: Positive for weakness. Negative for sensory change, speech change, focal weakness and headaches.  Endo/Heme/Allergies: Does not bruise/bleed easily.  Psychiatric/Behavioral: Negative for depression. The patient is not nervous/anxious.     DRUG ALLERGIES:   Allergies  Allergen Reactions  . Magnesium-Containing Compounds Hives  . Sulfa Antibiotics Hives  . Sulfonamide Derivatives     REACTION: hives  . Viagra [Sildenafil Citrate]     Blurred vision    VITALS:  Blood pressure 104/69, pulse 89, temperature (!) 97.5 F (36.4 C), temperature source Oral, resp. rate (!) 22, height 5\' 9"  (1.753 m), weight 73.5 kg (162 lb), SpO2 94 %.  PHYSICAL EXAMINATION:   Physical Exam  GENERAL:  62 y.o.-year-old patient lying in the bed with conversational dyspnea EYES: Pupils equal, round, reactive to light and accommodation. No scleral icterus. Extraocular muscles intact.  HEENT: Head atraumatic, normocephalic. Oropharynx and nasopharynx clear.  NECK:  Supple, no  jugular venous distention. No thyroid enlargement, no tenderness.  LUNGS: Bilateral wheezing. CARDIOVASCULAR: S1, S2 normal. No murmurs, rubs, or gallops.  ABDOMEN: Soft, nontender, nondistended. Bowel sounds present. No organomegaly or mass.  EXTREMITIES: No cyanosis, clubbing or edema b/l.    NEUROLOGIC: Cranial nerves II through XII are intact. No focal Motor or sensory deficits b/l.   PSYCHIATRIC: The patient is alert and oriented x 3.  SKIN: No obvious rash, lesion, or ulcer.   LABORATORY PANEL:   CBC Recent Labs  Lab 03/20/17 0558  WBC 14.4*  HGB 11.3*  HCT 32.9*  PLT 256   ------------------------------------------------------------------------------------------------------------------ Chemistries  Recent Labs  Lab 03/19/17 1359 03/20/17 0558  NA  --  133*  K  --  3.3*  CL  --  101  CO2  --  22  GLUCOSE  --  100*  BUN  --  19  CREATININE  --  0.81  CALCIUM  --  7.8*  MG 2.2  --    ------------------------------------------------------------------------------------------------------------------  Cardiac Enzymes Recent Labs  Lab 03/19/17 0810  TROPONINI <0.03   ------------------------------------------------------------------------------------------------------------------  RADIOLOGY:  Dg Chest 2 View  Result Date: 03/19/2017 CLINICAL DATA:  Left-sided chest, back, and rib pain, worse with inspiration. EXAM: CHEST  2 VIEW COMPARISON:  Chest x-ray dated Aug 04, 2011. FINDINGS: The cardiomediastinal silhouette is normal in size. Normal pulmonary vascularity. Consolidation within the left upper lobe. Volume loss in the left hemithorax with atelectasis in the left lower lobe. The right lung is clear. No pneumothorax or pleural effusion. No acute osseous abnormality. IMPRESSION: Consolidation within the left upper lobe, suspicious for pneumonia. Volume loss within the left hemithorax could reflect a postobstructive  component from underlying mass. Recommend CT of the  chest, with contrast if possible, for further evaluation. Electronically Signed   By: Obie DredgeWilliam T Derry M.D.   On: 03/19/2017 08:50   Ct Angio Chest Pe W And/or Wo Contrast  Result Date: 03/19/2017 CLINICAL DATA:  Shortness of breath, body aches EXAM: CT ANGIOGRAPHY CHEST WITH CONTRAST TECHNIQUE: Multidetector CT imaging of the chest was performed using the standard protocol during bolus administration of intravenous contrast. Multiplanar CT image reconstructions and MIPs were obtained to evaluate the vascular anatomy. CONTRAST:  75mL ISOVUE-370 IOPAMIDOL (ISOVUE-370) INJECTION 76% COMPARISON:  None. FINDINGS: Cardiovascular: No filling defects in the pulmonary arteries to suggest pulmonary emboli. Insert Heart Mediastinum/Nodes: Borderline sized mediastinal lymph nodes, likely reactive. Index prevascular lymph node has a short axis diameter of 9 mm. No axillary adenopathy or visible hilar adenopathy. Lungs/Pleura: Dense consolidation noted in the left upper lobe compatible with pneumonia. Moderate underlying centrilobular and paraseptal emphysema. Ground-glass nodular density in the right upper lobe measures 13 mm. Trace left pleural effusion. Upper Abdomen: Imaging into the upper abdomen shows no acute findings. Musculoskeletal: Chest wall soft tissues are unremarkable. No acute bony abnormality. Review of the MIP images confirms the above findings. IMPRESSION: Dense consolidation throughout much of the left upper lobe compatible with pneumonia. Moderate underlying centrilobular and paraseptal emphysema. 13 mm right upper lobe pulmonary nodule. Initial follow-up with CT at 6-12 months is recommended to confirm persistence. If persistent, repeat CT is recommended every 2 years until 5 years of stability has been established. This recommendation follows the consensus statement: Guidelines for Management of Incidental Pulmonary Nodules Detected on CT Images: From the Fleischner Society 2017; Radiology 2017;  284:228-243. No evidence of pulmonary embolus. Electronically Signed   By: Charlett NoseKevin  Dover M.D.   On: 03/19/2017 10:26   ASSESSMENT AND PLAN:   *Streptococcus pneumonia bacteremia secondary to left pneumonia Sepsis present on admission has resolved On IV ceftriaxone.  Azithromycin discontinued.  Wait for final blood cultures. Will add prednisone.  Stop IV fluids.  Repeat labs in the morning.  *Mild hyponatremia.  Improving. Likely SIADH.  IV fluids stopped.  *Right upper lobe 13 mm lung nodule.  Seen by pulmonary.  Will need outpatient follow-up and PET scan once pneumonia improves.  *Tobacco abuse.  Counseled to quit smoking.  *DVT prophylaxis with Lovenox  All the records are reviewed and case discussed with Care Management/Social Worker Management plans discussed with the patient, family and they are in agreement.  CODE STATUS: FULL CODE  DVT Prophylaxis: SCDs  TOTAL TIME TAKING CARE OF THIS PATIENT: 30 minutes.   POSSIBLE D/C IN 1-2 DAYS, DEPENDING ON CLINICAL CONDITION.  Orie FishermanSrikar R Nishika Parkhurst M.D on 03/20/2017 at 2:38 PM  Between 7am to 6pm - Pager - [phone]  After 6pm go to [email or web address] - password [email or web address]  [facility] [place] [facility]  Office  [phone]  CC: Primary care physician; Lance BoschBlevins, Andrea, NP  Note: This dictation was prepared with Dragon dictation along with smaller phrase technology. Any transcriptional errors that result from this process are unintentional.

## 2017-03-20 NOTE — Progress Notes (Signed)
PHARMACY - PHYSICIAN COMMUNICATION CRITICAL VALUE ALERT - BLOOD CULTURE IDENTIFICATION (BCID)  Drew Lee is an 62 y.o. male who [facility]2017 with a chief complaint of SOB  Assessment: HR 120's, RR 27 x 1, CXR consolidation in LUB x PNA (include suspected source if known)  Name of physician (or Provider) Contacted: Joycelyn RuaMichael Diamond  Current antibiotics: Azithromycin & Ceftriaxone  Changes to prescribed antibiotics recommended:  Recommendations accepted by provider -- Will increase ceftriaxone to 2g IV daily and discontinue azithromycin  Results for orders placed or performed during the hospital encounter of 03/19/17  Blood Culture ID Panel (Reflexed) (Collected: 03/19/2017 11:06 AM)  Result Value Ref Range   Enterococcus species NOT DETECTED NOT DETECTED   Listeria monocytogenes NOT DETECTED NOT DETECTED   Staphylococcus species NOT DETECTED NOT DETECTED   Staphylococcus aureus NOT DETECTED NOT DETECTED   Streptococcus species DETECTED (A) NOT DETECTED   Streptococcus agalactiae NOT DETECTED NOT DETECTED   Streptococcus pneumoniae DETECTED (A) NOT DETECTED   Streptococcus pyogenes NOT DETECTED NOT DETECTED   Acinetobacter baumannii NOT DETECTED NOT DETECTED   Enterobacteriaceae species NOT DETECTED NOT DETECTED   Enterobacter cloacae complex NOT DETECTED NOT DETECTED   Escherichia coli NOT DETECTED NOT DETECTED   Klebsiella oxytoca NOT DETECTED NOT DETECTED   Klebsiella pneumoniae NOT DETECTED NOT DETECTED   Proteus species NOT DETECTED NOT DETECTED   Serratia marcescens NOT DETECTED NOT DETECTED   Haemophilus influenzae NOT DETECTED NOT DETECTED   Neisseria meningitidis NOT DETECTED NOT DETECTED   Pseudomonas aeruginosa NOT DETECTED NOT DETECTED   Candida albicans NOT DETECTED NOT DETECTED   Candida glabrata NOT DETECTED NOT DETECTED   Candida krusei NOT DETECTED NOT DETECTED   Candida parapsilosis NOT DETECTED NOT DETECTED   Candida tropicalis NOT  DETECTED NOT DETECTED   Thomasene Rippleavid Palmer Fahrner, PharmD, BCPS Clinical Pharmacist 03/20/2017

## 2017-03-21 LAB — CBC WITH DIFFERENTIAL/PLATELET
BASOS ABS: 0.1 10*3/uL (ref 0–0.1)
BASOS PCT: 0 %
EOS ABS: 0 10*3/uL (ref 0–0.7)
EOS PCT: 0 %
HCT: 33.4 % — ABNORMAL LOW (ref 40.0–52.0)
Hemoglobin: 11.3 g/dL — ABNORMAL LOW (ref 13.0–18.0)
LYMPHS PCT: 7 %
Lymphs Abs: 1.1 10*3/uL (ref 1.0–3.6)
MCH: 32.5 pg (ref 26.0–34.0)
MCHC: 33.9 g/dL (ref 32.0–36.0)
MCV: 95.8 fL (ref 80.0–100.0)
Monocytes Absolute: 0.7 10*3/uL (ref 0.2–1.0)
Monocytes Relative: 4 %
Neutro Abs: 14.9 10*3/uL — ABNORMAL HIGH (ref 1.4–6.5)
Neutrophils Relative %: 89 %
PLATELETS: 349 10*3/uL (ref 150–440)
RBC: 3.49 MIL/uL — AB (ref 4.40–5.90)
RDW: 14.5 % (ref 11.5–14.5)
WBC: 16.8 10*3/uL — AB (ref 3.8–10.6)

## 2017-03-21 LAB — BASIC METABOLIC PANEL
Anion gap: 8 (ref 5–15)
BUN: 22 mg/dL — ABNORMAL HIGH (ref 6–20)
CO2: 24 mmol/L (ref 22–32)
Calcium: 8.3 mg/dL — ABNORMAL LOW (ref 8.9–10.3)
Chloride: 104 mmol/L (ref 101–111)
Creatinine, Ser: 0.81 mg/dL (ref 0.61–1.24)
GFR calc Af Amer: >60 mL/min (ref >60)
GLUCOSE: 123 mg/dL — AB (ref 65–99)
POTASSIUM: 4.2 mmol/L (ref 3.5–5.1)
Sodium: 136 mmol/L (ref 135–145)

## 2017-03-21 MED ORDER — CEFUROXIME AXETIL 500 MG PO TABS: 500.0000 mg | ORAL_TABLET | Freq: Two times a day (BID) | ORAL | 0 refills | Status: AC

## 2017-03-21 MED ORDER — CYCLOBENZAPRINE HCL 7.5 MG PO TABS: 7.5000 mg | ORAL_TABLET | Freq: Two times a day (BID) | ORAL | 0 refills | Status: AC | PRN

## 2017-03-21 NOTE — Discharge Summary (Signed)
[facility] [place] [facility]   PATIENT NAME: Drew Lee    MR#:  [id number]  DATE OF BIRTH:  01-09-1956  DATE OF ADMISSION:  03/19/2017   ADMITTING PHYSICIAN: Shaune Pollack, MD  DATE OF DISCHARGE: No discharge date for patient encounter.  PRIMARY CARE PHYSICIAN: Lance Bosch, NP   ADMISSION DIAGNOSIS:   Community acquired pneumonia of left upper lobe of lung (HCC) [J18.1]  DISCHARGE DIAGNOSIS:   Active Problems:   Sepsis (HCC)   SECONDARY DIAGNOSIS:   Past Medical History:  Diagnosis Date  . Anxiety   . Asthma   . Bipolar 1 disorder (HCC)   . Depression   . ED (erectile dysfunction)   . ETOH abuse   . Hyperlipidemia     HOSPITAL COURSE:   62 y/o M with PMH of COPD, hyperlipidemia, anxiety and depression, ongoing smoking and alcohol use presents to hospital secondary to fevers and shortness of breath.  * CAP- Streptococcus pneumonia bacteremia secondary to left pneumonia Sepsis present on admission has resolved On IV ceftriaxone.  Change to ceftin for 2 weeks at discharge  Off prednisone- no wheezing now Inhalers at discharge. -Continues over the counter cough medications. Flexeril as needed for chest pain from coughing  *Mild hyponatremia.  Improved. Received IV fluids  *Right upper lobe 13 mm lung nodule.  Seen by pulmonary.  Will need outpatient follow-up and PET scan once pneumonia improves. And possible biopsy  *Tobacco abuse.  Counseled to quit smoking.   DISCHARGE CONDITIONS:   Guarded  CONSULTS OBTAINED:   Treatment Team:  Mertie Moores, MD  DRUG ALLERGIES:   Allergies  Allergen Reactions  . Magnesium-Containing Compounds Hives  . Sulfa Antibiotics Hives  . Sulfonamide Derivatives     REACTION: hives  . Viagra [Sildenafil Citrate]     Blurred vision   DISCHARGE MEDICATIONS:   Allergies as of 03/21/2017      Reactions   Magnesium-containing Compounds Hives   Sulfa Antibiotics Hives   Sulfonamide  Derivatives    REACTION: hives   Viagra [sildenafil Citrate]    Blurred vision      Medication List    TAKE these medications   albuterol 108 (90 Base) MCG/ACT inhaler Commonly known as:  PROVENTIL HFA;VENTOLIN HFA Inhale 2 puffs into the lungs every 6 (six) hours as needed for wheezing or shortness of breath.   cefUROXime 500 MG tablet Commonly known as:  CEFTIN Take 1 tablet (500 mg total) by mouth 2 (two) times daily for 13 days.   cyclobenzaprine 7.5 MG tablet Commonly known as:  FEXMID Take 1 tablet (7.5 mg total) by mouth 2 (two) times daily as needed for muscle spasms.   folic acid 800 MCG tablet Commonly known as:  FOLVITE Take 800 mcg by mouth daily.   Green Tea Extract 150 MG Caps Take 1 capsule by mouth daily.   lamoTRIgine 100 MG tablet Commonly known as:  LAMICTAL Take 100 mg by mouth daily.   multivitamin with minerals tablet Take 1 tablet by mouth daily.   Omega 3 1000 MG Caps Take 1,000 mg by mouth 2 (two) times daily.   Red Yeast Rice 600 MG Caps Take by mouth.   vitamin B-1 250 MG tablet Take 250 mg by mouth daily.        DISCHARGE INSTRUCTIONS:   1. PCP f/u in 1 week  DIET:   Cardiac diet  ACTIVITY:   Activity as tolerated  OXYGEN:   Home Oxygen: No.  Oxygen Delivery: room air  DISCHARGE LOCATION:   home   If you experience worsening of your admission symptoms, develop shortness of breath, life threatening emergency, suicidal or homicidal thoughts you must seek medical attention immediately by calling 911 or calling your MD immediately  if symptoms less severe.  You Must read complete instructions/literature along with all the possible adverse reactions/side effects for all the Medicines you take and that have been prescribed to you. Take any new Medicines after you have completely understood and accpet all the possible adverse reactions/side effects.   Please note  You were cared for by a hospitalist during your hospital  stay. If you have any questions about your discharge medications or the care you received while you were in the hospital after you are discharged, you can call the unit and asked to speak with the hospitalist on call if the hospitalist that took care of you is not available. Once you are discharged, your primary care physician will handle any further medical issues. Please note that NO REFILLS for any discharge medications will be authorized once you are discharged, as it is imperative that you return to your primary care physician (or establish a relationship with a primary care physician if you do not have one) for your aftercare needs so that they can reassess your need for medications and monitor your lab values.    On the day of Discharge:  VITAL SIGNS:   Blood pressure 110/78, pulse 66, temperature (!) 97.5 F (36.4 C), temperature source Oral, resp. rate (!) 22, height 5\' 9"  (1.753 m), weight 73.5 kg (162 lb), SpO2 94 %.  PHYSICAL EXAMINATION:    GENERAL:  62 y.o.-year-old patient lying in the bed with no acute distress.  EYES: Pupils equal, round, reactive to light and accommodation. No scleral icterus. Extraocular muscles intact.  HEENT: Head atraumatic, normocephalic. Oropharynx and nasopharynx clear.  NECK:  Supple, no jugular venous distention. No thyroid enlargement, no tenderness.  LUNGS: moving air bilaterally,  no wheezing, no rales,rhonchi or crepitation. No use of accessory muscles of respiration.  CARDIOVASCULAR: S1, S2 normal. No murmurs, rubs, or gallops.  ABDOMEN: Soft, non-tender, non-distended. Bowel sounds present. No organomegaly or mass.  EXTREMITIES: No pedal edema, cyanosis, or clubbing.  NEUROLOGIC: Cranial nerves II through XII are intact. Muscle strength 5/5 in all extremities. Sensation intact. Gait not checked.  PSYCHIATRIC: The patient is alert and oriented x 3.  SKIN: No obvious rash, lesion, or ulcer.   DATA REVIEW:   CBC Recent Labs  Lab  03/21/17 0330  WBC 16.8*  HGB 11.3*  HCT 33.4*  PLT 349    Chemistries  Recent Labs  Lab 03/19/17 1359  03/21/17 0330  NA  --    < > 136  K  --    < > 4.2  CL  --    < > 104  CO2  --    < > 24  GLUCOSE  --    < > 123*  BUN  --    < > 22*  CREATININE  --    < > 0.81  CALCIUM  --    < > 8.3*  MG 2.2  --   --    < > = values in this interval not displayed.     Microbiology Results  Results for orders placed or performed during the hospital encounter of 03/19/17  Blood Culture (routine x 2)     Status: None (Preliminary result)   Collection Time:  03/19/17 11:06 AM  Result Value Ref Range Status   Specimen Description   Final    BLOOD LAC Performed at [facility], [address] [place], [place] [address]    Special Requests   Final    BOTTLES DRAWN AEROBIC AND ANAEROBIC Blood Culture adequate volume Performed at [facility], [address] [place], [place] [address]    Culture  Setup Time   Final    GRAM POSITIVE COCCI IN BOTH AEROBIC AND ANAEROBIC BOTTLES CRITICAL RESULT CALLED TO, READ BACK BY AND VERIFIED WITH: DAVID BESANTI AT 0249 03/20/17 ALV    Culture GRAM POSITIVE COCCI  Final   Report Status PENDING  Incomplete  Blood Culture ID Panel (Reflexed)     Status: Abnormal   Collection Time: 03/19/17 11:06 AM  Result Value Ref Range Status   Enterococcus species NOT DETECTED NOT DETECTED Final   Listeria monocytogenes NOT DETECTED NOT DETECTED Final   Staphylococcus species NOT DETECTED NOT DETECTED Final   Staphylococcus aureus NOT DETECTED NOT DETECTED Final   Streptococcus species DETECTED (A) NOT DETECTED Final    Comment: CRITICAL RESULT CALLED TO, READ BACK BY AND VERIFIED WITH: DAVID BESANTI AT 0249 03/20/17 ALV    Streptococcus agalactiae NOT DETECTED NOT DETECTED Final   Streptococcus pneumoniae DETECTED (A) NOT DETECTED Final    Comment: CRITICAL RESULT CALLED TO, READ BACK BY AND VERIFIED WITH: DAVID BESANTI AT 0249 03/20/17  ALV    Streptococcus pyogenes NOT DETECTED NOT DETECTED Final   Acinetobacter baumannii NOT DETECTED NOT DETECTED Final   Enterobacteriaceae species NOT DETECTED NOT DETECTED Final   Enterobacter cloacae complex NOT DETECTED NOT DETECTED Final   Escherichia coli NOT DETECTED NOT DETECTED Final   Klebsiella oxytoca NOT DETECTED NOT DETECTED Final   Klebsiella pneumoniae NOT DETECTED NOT DETECTED Final   Proteus species NOT DETECTED NOT DETECTED Final   Serratia marcescens NOT DETECTED NOT DETECTED Final   Haemophilus influenzae NOT DETECTED NOT DETECTED Final   Neisseria meningitidis NOT DETECTED NOT DETECTED Final   Pseudomonas aeruginosa NOT DETECTED NOT DETECTED Final   Candida albicans NOT DETECTED NOT DETECTED Final   Candida glabrata NOT DETECTED NOT DETECTED Final   Candida krusei NOT DETECTED NOT DETECTED Final   Candida parapsilosis NOT DETECTED NOT DETECTED Final   Candida tropicalis NOT DETECTED NOT DETECTED Final    Comment: Performed at [facility], [address] [place], [place] [address]  Blood Culture (routine x 2)     Status: None (Preliminary result)   Collection Time: 03/19/17 11:21 AM  Result Value Ref Range Status   Specimen Description   Final    BLOOD RIGHT [facility] Performed at [facility], [address] [place], [place] [address]    Special Requests   Final    BOTTLES DRAWN AEROBIC AND ANAEROBIC Blood Culture adequate volume Performed at [facility], [address] [place], [place] [address]    Culture  Setup Time   Final    GRAM POSITIVE COCCI IN BOTH AEROBIC AND ANAEROBIC BOTTLES CRITICAL RESULT CALLED TO, READ BACK BY AND VERIFIED WITH: DAVID BESANTI AT 0249 03/20/17 ALV    Culture GRAM POSITIVE COCCI  Final   Report Status PENDING  Incomplete    RADIOLOGY:  No results found.   Management plans discussed with the patient, family and they are in agreement.  CODE STATUS:     Code Status Orders  (From admission,  onward)  Start     Ordered   03/19/17 1319  Full code  Continuous     03/19/17 1318    Code Status History    Date Active Date Inactive Code Status Order ID Comments User Context   This patient has a current code status but no historical code status.      TOTAL TIME TAKING CARE OF THIS PATIENT: 38 minutes.    Enid Baas M.D on 03/21/2017 at 8:10 AM  Between 7am to 6pm - Pager - [phone]  After 6pm go to [email or web address] - Social research officer, government  [facility]  Office  [phone]  CC: Primary care physician; Lance Bosch, NP   Note: This dictation was prepared with Dragon dictation along with smaller phrase technology. Any transcriptional errors that result from this process are unintentional.

## 2017-03-21 NOTE — Progress Notes (Signed)
Discharge instructions along with home medications and follow up gone over with patient. He verbalized that he understood instructions. 2 prescriptions given to patient. IV's removed. Pt being discharged home on room air, no distress noted. Damaria Stofko S Fenton, RN 

## 2017-03-22 LAB — CULTURE, BLOOD (ROUTINE X 2)
SPECIAL REQUESTS: ADEQUATE
Special Requests: ADEQUATE

## 2017-04-10 ENCOUNTER — Ambulatory Visit
Admission: RE | Admit: 2017-04-10 | Discharge: 2017-04-10 | Disposition: A | Discharge status: Home / Self Care | Payer: 59 | Source: Ambulatory Visit | Attending: Family Medicine | Admitting: Family Medicine

## 2017-04-10 ENCOUNTER — Other Ambulatory Visit: Payer: Self-pay | Admitting: Family Medicine

## 2017-04-10 DIAGNOSIS — Z09 Encounter for follow-up examination after completed treatment for conditions other than malignant neoplasm: Secondary | ICD-10-CM

## 2017-04-13 ENCOUNTER — Encounter: Payer: Self-pay | Admitting: Internal Medicine

## 2017-04-13 ENCOUNTER — Ambulatory Visit: Payer: 59 | Admitting: Internal Medicine

## 2017-04-13 VITALS — BP 118/78 | HR 87 | Ht 69.0 in | Wt 158.8 lb

## 2017-04-13 DIAGNOSIS — F17210 Nicotine dependence, cigarettes, uncomplicated: Secondary | ICD-10-CM

## 2017-04-13 DIAGNOSIS — R911 Solitary pulmonary nodule: Secondary | ICD-10-CM

## 2017-04-13 DIAGNOSIS — J13 Pneumonia due to Streptococcus pneumoniae: Secondary | ICD-10-CM

## 2017-04-13 DIAGNOSIS — J449 Chronic obstructive pulmonary disease, unspecified: Secondary | ICD-10-CM | POA: Diagnosis not present

## 2017-04-13 NOTE — Patient Instructions (Addendum)
The key is to stop smoking completely before smoking completely stops you!   Please schedule a follow up office visit in 4 weeks, sooner if needed with pfts on return  - add cxr on return

## 2017-04-13 NOTE — Progress Notes (Signed)
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  * CAP- Streptococcus pneumonia bacteremia secondary to left pneumonia (BC pos sensitive to pcn) Sepsis present on admission  resolved On IV ceftriaxone.  Change to ceftin for 2 weeks at discharge Off prednisone- no wheezing now Inhalers at discharge. -Continues over the counter cough medications. Flexeril as needed for chest pain from coughing  *Mild hyponatremia. Improved. Received IV fluids  *Right upper lobe 13 mm GG nodular density. Seen by pulmonary Meredeth Ide([facility]) rec   outpatient follow-up and PET scan once pneumonia improves. And possible biopsy  *Tobacco abuse. Counseled to quit smoking.       04/13/2017 1st [facility] office visit/ [facility]   Chief Complaint  Patient presents with  . pulmonary consult    referred by Dr. Jeannetta NapElkins SOB post pna with pain on left side when takes a big deep breath wheezing at night   whereas previous doe = [facility]1 = can walk nl pace, flat grade, can't hurry or go uphills or steps s sob   Same post hosp ? In denial  Pain is down to 2/10 L post chest wall/ flank and LUQ ? Dermatomal? (no h/o rash or back injury) Alb use just once a week now and now maint rx   No obvious day to day or daytime variability or assoc excess/ purulent sputum or mucus plugs or hemoptysis  or chest tightness, subjective wheeze or overt sinus or hb symptoms. No unusual exposure hx or h/o childhood pna/ asthma or knowledge of premature birth.  Sleeping ok flat without nocturnal  or early am exacerbation  of respiratory  c/o's or need  for noct saba. Also denies any obvious fluctuation of symptoms with weather or environmental changes or other aggravating or alleviating factors except as outlined above   Current Allergies, Complete Past Medical History, Past Surgical History, Family History, and Social History were reviewed in [facility] electronic medical record.  ROS  The following are not active complaints unless bolded Hoarseness, sore throat, dysphagia, dental problems, itching, sneezing,  nasal congestion or discharge of excess mucus or purulent secretions, ear ache,   fever, chills, sweats, unintended wt loss or wt gain, classically  exertional cp,  orthopnea pnd or leg swelling, presyncope, palpitations, abdominal pain, anorexia, nausea, vomiting, diarrhea  or change in bowel habits or change in bladder habits, change in stools or change in urine, dysuria, hematuria,  rash, arthralgias, visual complaints, headache, numbness, weakness or ataxia or problems with walking or coordination,  change in mood/affect or memory.        Current Meds  Medication Sig  . albuterol (PROVENTIL HFA;VENTOLIN HFA) 108 (90 BASE) MCG/ACT inhaler Inhale 2 puffs into the lungs every 6 (six) hours as needed for wheezing or shortness of breath.  . folic acid (FOLVITE) 800 MCG tablet Take 800 mcg by mouth daily.  Chilton Si. Green Tea, Camillia sinensis, (GREEN TEA EXTRACT) 150 MG CAPS Take 1 capsule by mouth daily.  Marland Kitchen. lamoTRIgine (LAMICTAL) 100 MG tablet Take 100 mg by mouth daily.  . Multiple  Vitamins-Minerals (MULTIVITAMIN WITH MINERALS) tablet Take 1 tablet by mouth daily.  . Omega 3 1000 MG CAPS Take 1,000 mg by mouth 2 (two) times daily.  . Red Yeast Rice 600 MG CAPS Take by mouth.  . Thiamine HCl (VITAMIN B-1) 250 MG tablet Take 250 mg by mouth daily.       Review of Systems     Objective:   Physical Exam    amb stoic  wm nad  Wt Readings from Last 3 Encounters:  04/13/17 158 lb 12.8 oz (72 kg)  03/19/17 162 lb (73.5 kg)  03/16/10  179 lb 3.2 oz (81.3 kg)     Vital signs reviewed - Note on arrival 02 sats  95% on RA     Poor dentition / mild copd    HEENT: nl dentition, turbinates bilaterally, and oropharynx. Nl external ear canals without cough reflex   NECK :  without JVD/Nodes/TM/ nl carotid upstrokes bilaterally   LUNGS: no acc muscle use,  barrel contour chest wall with bilateral  decreaesed  without cough on insp or exp maneuver and mild hyper resonance to  percussion     CV:  RRR  no s3 or murmur or increase in P2, and no edema   ABD:  soft and nontender with nl inspiratory excursion in the supine position. No bruits or organomegaly appreciated, bowel sounds nl  MS:  Nl gait/ ext warm without deformities, calf tenderness, cyanosis or clubbing No obvious joint restrictions   SKIN: warm and dry without lesions    NEURO:  alert, approp, nl sensorium with  no motor or cerebellar deficits apparent.       I personally reviewed images and agree with radiology impression as follows:  CXR:   04/10/17 Decreased left upper lobe consolidation since 03/19/2017, with residual opacity in this area. Continued radiographic follow-up to resolution recommended.     Assessment:

## 2017-04-14 DIAGNOSIS — R911 Solitary pulmonary nodule: Secondary | ICD-10-CM | POA: Insufficient documentation

## 2017-04-14 DIAGNOSIS — J13 Pneumonia due to Streptococcus pneumoniae: Secondary | ICD-10-CM | POA: Insufficient documentation

## 2017-04-14 NOTE — Assessment & Plan Note (Signed)
Clinically resolved with cxr lag > recheck cxr at next ov

## 2017-04-14 NOTE — Assessment & Plan Note (Addendum)
Spirometry 04/13/2017  FEV1 2.78 (81%)  Ratio 68 s rx prior and mild curvature     I reviewed the Fletcher curve with the patient that basically indicates  if you quit smoking when your best day FEV1 is still well   preserved (as is clearly  the case here)  it is highly unlikely you will progress to severe disease and informed the patient there was  no medication on the market that has proven to alter the curve/ its downward trajectory  or the likelihood of progression of their disease(unlike other chronic medical conditions such as atheroclerosis where we do think we can change the natural hx with risk reducing meds)    Therefore stopping smoking and maintaining abstinence are  the most important aspects of care, not choice of inhalers or for that matter, doctors.   Treatment other than smoking cessation  is entirely directed by severity of symptoms and focused also on reducing exacerbations, not attempting to change the natural history of the disease.  saba prn is all he appears to need for now but will f/u with full  pfts  To check for emphysematous changes as well.

## 2017-04-14 NOTE — Assessment & Plan Note (Addendum)
See CT 03/19/17 13 mm right upper lobe pulmonary nodule (GG). Initial follow-up with CT at 6-12 months is recommended to confirm persistence  CT results reviewed with pt >>> Too small for PET or bx, not suspicious enough for excisional bx > really only option for now is follow the Fleischner society guidelines as rec by radiology.  Since this has GG features it is not likely to be an aggressive bronchogenic ca and more likely inflammatory but he is a smoker and at risk so would rec repeat ct on the early side of the guidelines > placed in computer for 09/16/17    Discussed in detail all the  indications, usual  risks and alternatives  relative to the benefits with patient who agrees to proceed with conservative f/u as outlined

## 2017-04-14 NOTE — Assessment & Plan Note (Signed)

## 2017-04-16 ENCOUNTER — Encounter: Payer: Self-pay | Admitting: Internal Medicine

## 2017-05-10 ENCOUNTER — Other Ambulatory Visit: Payer: Self-pay | Admitting: Internal Medicine

## 2017-05-10 DIAGNOSIS — J449 Chronic obstructive pulmonary disease, unspecified: Secondary | ICD-10-CM

## 2017-05-11 ENCOUNTER — Encounter: Payer: Self-pay | Admitting: Internal Medicine

## 2017-05-11 ENCOUNTER — Ambulatory Visit (INDEPENDENT_AMBULATORY_CARE_PROVIDER_SITE_OTHER): Payer: 59 | Admitting: Internal Medicine

## 2017-05-11 ENCOUNTER — Ambulatory Visit (INDEPENDENT_AMBULATORY_CARE_PROVIDER_SITE_OTHER)
Admission: RE | Admit: 2017-05-11 | Discharge: 2017-05-11 | Disposition: A | Discharge status: Home / Self Care | Payer: 59 | Source: Ambulatory Visit | Attending: Internal Medicine | Admitting: Internal Medicine

## 2017-05-11 VITALS — BP 122/84 | HR 89 | Ht 67.5 in | Wt 160.0 lb

## 2017-05-11 DIAGNOSIS — J449 Chronic obstructive pulmonary disease, unspecified: Secondary | ICD-10-CM

## 2017-05-11 DIAGNOSIS — R911 Solitary pulmonary nodule: Secondary | ICD-10-CM | POA: Diagnosis not present

## 2017-05-11 DIAGNOSIS — F17210 Nicotine dependence, cigarettes, uncomplicated: Secondary | ICD-10-CM | POA: Diagnosis not present

## 2017-05-11 DIAGNOSIS — J13 Pneumonia due to Streptococcus pneumoniae: Secondary | ICD-10-CM | POA: Diagnosis not present

## 2017-05-11 LAB — PULMONARY FUNCTION TEST
DL/VA % pred: 77 %
DL/VA: 3.44 ml/min/mmHg/L
DLCO COR % PRED: 89 %
DLCO COR: 25.61 ml/min/mmHg
DLCO UNC: 22.86 ml/min/mmHg
DLCO unc % pred: 79 %
FEF 25-75 POST: 2.05 L/s
FEF 25-75 Pre: 1.61 L/sec
FEF2575-%Change-Post: 27 %
FEF2575-%Pred-Post: 77 %
FEF2575-%Pred-Pre: 60 %
FEV1-%Change-Post: 7 %
FEV1-%PRED-POST: 101 %
FEV1-%Pred-Pre: 94 %
FEV1-POST: 3.27 L
FEV1-Pre: 3.03 L
FEV1FVC-%Change-Post: 1 %
FEV1FVC-%PRED-PRE: 82 %
FEV6-%Change-Post: 5 %
FEV6-%Pred-Post: 123 %
FEV6-%Pred-Pre: 117 %
FEV6-POST: 5.03 L
FEV6-Pre: 4.78 L
FEV6FVC-%Change-Post: 0 %
FEV6FVC-%PRED-POST: 102 %
FEV6FVC-%Pred-Pre: 102 %
FVC-%Change-Post: 5 %
FVC-%PRED-POST: 121 %
FVC-%PRED-PRE: 114 %
FVC-POST: 5.19 L
FVC-PRE: 4.9 L
POST FEV1/FVC RATIO: 63 %
Post FEV6/FVC ratio: 97 %
Pre FEV1/FVC ratio: 62 %
Pre FEV6/FVC Ratio: 98 %
RV % pred: 154 %
RV: 3.28 L
TLC % PRED: 132 %
TLC: 8.56 L

## 2017-05-11 NOTE — Patient Instructions (Addendum)
Only use your albuterol as a rescue medication to be used if you can't catch your breath by resting or doing a relaxed purse lip breathing pattern.  - The less you use it, the better it will work when you need it. - Ok to use up to 2 puffs  every 4 hours if you must but call for immediate appointment if use goes up over your usual need - Don't leave home without it !!  (think of it like the spare tire for your car)   Please remember to go to the  x-ray department downstairs in the basement  for your tests - we will call you with the results when they are available.     You have  Very mild copd which will not progress if you stop smoking now - good luck!    If you are satisfied with your treatment plan,  let your doctor know and he/she can either refill your medications or you can return here when your prescription runs out.     If in any way you are not 100% satisfied,  please tell us.  If 100% better, tell your friends!  Pulmonary follow up is as needed

## 2017-05-11 NOTE — Progress Notes (Signed)
Patient completed full PFT today. 

## 2017-05-11 NOTE — Progress Notes (Signed)
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  * CAP- Streptococcus pneumonia bacteremia secondary to LUL  pneumonia (BC pos sensitive to pcn) Sepsis present on admission  resolved On IV ceftriaxone.  Change to ceftin for 2 weeks at discharge Off prednisone- no wheezing now Inhalers at discharge. -Continues over the counter cough medications. Flexeril as needed for chest pain from coughing  *Mild hyponatremia. Improved. Received IV fluids  *Right upper lobe 13 mm GG nodular density. Seen by pulmonary Drew Lee) rec   outpatient follow-up and PET scan once pneumonia improves. And possible biopsy     04/13/2017 1st [place] [facility] office visit/ Drew Lee   Chief Complaint  Patient presents with  . pulmonary consult    referred by Dr. Jeannetta Lee SOB post pna with pain on left side when takes a big deep breath wheezing at night   whereas previous doe = MMRC1 = can walk nl pace, flat grade, can't hurry or go uphills or steps s sob   Same as prior to  hosp   Pleuritic L Chest Pain is down to 2/10=  L post chest wall/ flank and LUQ ? Dermatomal? (no h/o rash or back injury) Alb use just once a week now and now maint rx rec Stop smoking Return for pfts/ cxr    05/11/2017  f/u ov/Drew Lee re:  GOLD I copd / f/u LUL pna/ RUL spn  Chief Complaint  Patient presents with  . Follow-up    PFT's done today. His breathing has improved and he states has not had to use his albuterol inhaler.    Dyspnea:  MMRC1 = can walk nl pace, flat grade, can't hurry or go uphills or steps s sob   Cough: none Sleep: ok SABA  use: only uses if over does it  CP almost  completely resolved/ just feels a little tight with deep insp L post chest    No obvious day to day or daytime variability or assoc excess/ purulent sputum or mucus plugs or hemoptysis   or   subjective wheeze or overt sinus or hb symptoms. No unusual exposure hx or h/o childhood pna/ asthma or knowledge of premature birth.  Sleeping ok flat without nocturnal  or early am exacerbation  of respiratory  c/o's or need for noct saba. Also denies any obvious fluctuation of symptoms with weather or environmental changes or other aggravating or alleviating factors except as outlined above   Current Allergies, Complete Past Medical History, Past Surgical History, Family History, and Social History were reviewed in [employer] record.  ROS  The following are not active complaints unless bolded Hoarseness, sore throat, dysphagia, dental problems, itching, sneezing,  nasal congestion or discharge of excess mucus or purulent secretions, ear ache,   fever, chills, sweats, unintended wt loss or wt gain, classically pleuritic or exertional cp,  orthopnea pnd or leg swelling, presyncope, palpitations, abdominal pain, anorexia, nausea, vomiting, diarrhea  or change in bowel habits or change in bladder habits, change in stools or change in urine, dysuria, hematuria,  rash,  arthralgias, visual complaints, headache, numbness, weakness or ataxia or problems with walking or coordination,  change in mood/affect or memory.        Current Meds  Medication Sig  . albuterol (PROVENTIL HFA;VENTOLIN HFA) 108 (90 BASE) MCG/ACT inhaler Inhale 2 puffs into the lungs every 6 (six) hours as needed for wheezing or shortness of breath.  . cyclobenzaprine (FEXMID) 7.5 MG tablet Take 1 tablet (7.5 mg total) by mouth 2 (two) times daily as needed for muscle spasms.  . folic acid (FOLVITE) 800 MCG tablet Take 800 mcg by mouth daily.  Chilton Si. Green Tea, Camillia sinensis, (GREEN  TEA EXTRACT) 150 MG CAPS Take 1 capsule by mouth daily.  Marland Kitchen. lamoTRIgine (LAMICTAL) 100 MG tablet Take 100 mg by mouth daily.  . Multiple Vitamins-Minerals (MULTIVITAMIN WITH MINERALS) tablet Take 1 tablet by mouth daily.  . Omega 3 1000 MG CAPS Take 1,000 mg by mouth 2 (two) times daily.  . Red Yeast Rice 600 MG CAPS Take by mouth.  . Thiamine HCl (VITAMIN B-1) 250 MG tablet Take 250 mg by mouth daily.               Objective:   Physical Exam  amb wm nad    05/11/2017         160   04/13/17 158 lb 12.8 oz (72 kg)  03/19/17 162 lb (73.5 kg)  03/16/10 179 lb 3.2 oz (81.3 kg)      Vital signs reviewed - Note on arrival 02 sats  97% on RA      Poor dentition / mild copd    HEENT: nl  turbinates bilaterally, and oropharynx. Nl external ear canals without cough reflex - poor dentition    NECK :  without JVD/Nodes/TM/ nl carotid upstrokes bilaterally   LUNGS: no acc muscle use,  Barrel contour chest wall with bilateral  Decreased bs s wheeze and  without cough on insp or exp maneuver and mildly   Hyperresonant  to  percussion bilaterally       CV:  RRR  no s3 or murmur or increase in P2, and no edema   ABD:  soft and nontender with nl inspiratory excursion in the supine position. No bruits or organomegaly appreciated, bowel sounds nl  MS:  Nl gait/ ext warm without deformities, calf tenderness, cyanosis or clubbing No obvious joint restrictions   SKIN: warm and dry without lesions    NEURO:  alert, approp, nl sensorium with  no motor or cerebellar deficits apparent.         CXR PA and Lateral:   05/11/2017 :    I personally reviewed images and agree with radiology impression as follows:    Significantly improved left upper lobe atelectasis or pneumonia is noted, although residual opacity remains. Continued radiographic follow-up is recommended to ensure resolution and rule out underlying neoplasm. My impression:  Vs priors much less dense atx     Assessment:

## 2017-05-12 ENCOUNTER — Encounter: Payer: Self-pay | Admitting: Internal Medicine

## 2017-05-12 NOTE — Assessment & Plan Note (Addendum)
See CT 03/19/17 13 mm right upper lobe pulmonary nodule (GG). Initial follow-up with CT at 6-12 months is recommended to confirm persistence > rec  placed in computer for 09/16/17 recall   Discussed in detail all the  indications, usual  risks and alternatives  relative to the benefits with patient who agrees to proceed with conservative f/u as outlined for GG changes In RUL and atx changes in LUL  I had an extended discussion with the patient reviewing all relevant studies completed to date and  lasting 15 to 20 minutes of a 25 minute visit    Each maintenance medication was reviewed in detail including most importantly the difference between maintenance and prns and under what circumstances the prns are to be triggered using an action plan format that is not reflected in the computer generated alphabetically organized AVS.    Please see AVS for specific instructions unique to this visit that I personally wrote and verbalized to the the pt in detail and then reviewed with pt  by my nurse highlighting any  changes in therapy recommended at today's visit to their plan of care.

## 2017-05-12 NOTE — Assessment & Plan Note (Signed)
Marked serial improvement with strep pneumo documented at last admit by blood culture so rec f/u when return to look at the spn on the right unless new symptoms on the L (cp has almost completely resolved)

## 2017-05-12 NOTE — Assessment & Plan Note (Signed)
Spirometry 04/13/2017  FEV1 2.78 (81%)  Ratio 68 s rx prior and mild curvature   - PFT's  05/11/2017  FEV1 3.27 (101 % ) ratio 63  p 7 % improvement from saba p nothing prior to study with DLCO  79/89c % corrects to 77 % for alv volume  Mild curvature    He is back to baseline ex tol and no need for saba at this point so main focus will be on smoking cessation (see separate a/p)

## 2017-05-12 NOTE — Assessment & Plan Note (Signed)
>   3 m I took an extended  opportunity with this patient to outline the consequences of continued cigarette use  in airway disorders based on all the data we have from the multiple national lung health studies (perfomed over decades at millions of dollars in cost)  indicating that smoking cessation, not choice of inhalers or physicians, is the most important aspect of  His care.  Follow up per Primary Care planned

## 2017-05-14 NOTE — Progress Notes (Signed)
LMTCB

## 2017-05-15 ENCOUNTER — Telehealth: Payer: Self-pay | Admitting: Internal Medicine

## 2017-05-15 NOTE — Telephone Encounter (Signed)
Called pt and advised message from the provider. Pt understood and verbalized understanding. Nothing further is needed.    

## 2017-05-15 NOTE — Telephone Encounter (Signed)
He is in our reminder file for 09/2017 and we'll call

## 2017-05-15 NOTE — Progress Notes (Signed)
LMTCB

## 2017-05-15 NOTE — Telephone Encounter (Signed)
Notes recorded by Nyoka CowdenWert, Michael B, MD on 05/14/2017 at 1:33 PM EDT Call pt: Reviewed cxr and Further improvement so no change in recommendations made at ov- will be due for f/u CT next / be sure to keep appt and nothing to do in meantime unless symptoms worsen  .Called pt and advised message from the provider. Pt understood and verbalized understanding. MW when do you want him to have the CT?

## 2017-08-07 ENCOUNTER — Other Ambulatory Visit: Payer: Self-pay | Admitting: Internal Medicine

## 2017-08-07 DIAGNOSIS — R911 Solitary pulmonary nodule: Secondary | ICD-10-CM

## 2017-09-17 ENCOUNTER — Ambulatory Visit (INDEPENDENT_AMBULATORY_CARE_PROVIDER_SITE_OTHER)
Admission: RE | Admit: 2017-09-17 | Discharge: 2017-09-17 | Disposition: A | Discharge status: Home / Self Care | Payer: 59 | Source: Ambulatory Visit | Attending: Internal Medicine | Admitting: Internal Medicine

## 2017-09-17 ENCOUNTER — Telehealth: Payer: Self-pay | Admitting: Internal Medicine

## 2017-09-17 DIAGNOSIS — R911 Solitary pulmonary nodule: Secondary | ICD-10-CM

## 2017-09-17 NOTE — Telephone Encounter (Signed)
Called and spoke with pt letting him know the results of CT scan.  Pt expressed understanding. Asked pt if he had a f/u appt scheduled with us and pt stated he did not.  Scheduled a f/u appt with Buelah ManisBeth Walsh, NP 7/29 at 9am. Nothing further needed.

## 2017-09-17 NOTE — Progress Notes (Signed)
LMTCB

## 2017-10-01 ENCOUNTER — Ambulatory Visit: Payer: 59 | Admitting: Primary Care

## 2017-11-01 ENCOUNTER — Telehealth: Payer: Self-pay | Admitting: Internal Medicine

## 2017-11-01 NOTE — Telephone Encounter (Signed)
Attempted to call pt. I did not receive an answer. I have left a message for pt to return our call.  

## 2017-11-02 NOTE — Telephone Encounter (Signed)
LMTCB

## 2017-11-05 ENCOUNTER — Ambulatory Visit: Payer: 59 | Admitting: Primary Care

## 2017-11-06 ENCOUNTER — Ambulatory Visit: Payer: 59 | Admitting: Primary Care

## 2017-11-06 NOTE — Telephone Encounter (Signed)
Called patient unable to reach left message to give us a call back.

## 2017-11-06 NOTE — Telephone Encounter (Signed)
Spoke with patient, discussed results of CT per MD Wert. Voiced understanding. Appointment made for Friday December 07, 2017 @ 845am. Nothing further needed at this time.

## 2017-11-06 NOTE — Telephone Encounter (Signed)
Pt is calling back [phone]

## 2017-11-12 ENCOUNTER — Ambulatory Visit: Payer: 59 | Admitting: Primary Care

## 2017-12-07 ENCOUNTER — Encounter: Payer: Self-pay | Admitting: Internal Medicine

## 2017-12-07 ENCOUNTER — Ambulatory Visit: Payer: 59 | Admitting: Internal Medicine

## 2017-12-07 DIAGNOSIS — J449 Chronic obstructive pulmonary disease, unspecified: Secondary | ICD-10-CM

## 2017-12-07 DIAGNOSIS — K089 Disorder of teeth and supporting structures, unspecified: Secondary | ICD-10-CM | POA: Insufficient documentation

## 2017-12-07 DIAGNOSIS — R911 Solitary pulmonary nodule: Secondary | ICD-10-CM

## 2017-12-07 DIAGNOSIS — F17210 Nicotine dependence, cigarettes, uncomplicated: Secondary | ICD-10-CM | POA: Diagnosis not present

## 2017-12-07 MED ORDER — AZITHROMYCIN 250 MG PO TABS: ORAL_TABLET | ORAL | 0 refills | Status: DC

## 2017-12-07 NOTE — Assessment & Plan Note (Signed)
Advised to follow up with dentist as planned

## 2017-12-07 NOTE — Assessment & Plan Note (Signed)
4-5 min discussion re active cigarette smoking in addition to office E&M  Ask about tobacco use:   active Advise quitting   > 3 min discussion I reviewed the Fletcher curve with the patient that basically indicates  if you quit smoking when your best day FEV1 is still  preserved (as is still  the case here)  it is highly unlikely you will progress to severe disease and informed the patient there was  no medication on the market that has proven to alter the curve/ its downward trajectory  or the likelihood of progression of their disease(unlike other chronic medical conditions such as atheroclerosis where we do think we can change the natural hx with risk reducing meds)    Therefore stopping smoking and maintaining abstinence are  the most important aspects of care, not choice of inhalers or for that matter, doctors.  Treatment other than smoking cessation  is entirely directed by severity of symptoms and focused also on reducing exacerbations, not attempting to change the natural history of the disease.   Assess willingness:  Not committed at this point Assist in quit attempt:  Per PCP when ready Arrange follow up:   Follow up per Primary Care planned     F/u in 6 months with pfts to see if progressing per the fletcher principle "need to connect the dots" and if so use this to reinforce the above message     I had an extended discussion with the patient reviewing all relevant studies completed to date and  lasting 15 to 20 minutes of a 25 minute visit    Each maintenance medication was reviewed in detail including most importantly the difference between maintenance and prns and under what circumstances the prns are to be triggered using an action plan format that is not reflected in the computer generated alphabetically organized AVS.     Please see AVS for specific instructions unique to this visit that I personally wrote and verbalized to the the pt in detail and then reviewed with pt  by my  nurse highlighting any  changes in therapy recommended at today's visit to their plan of care.

## 2017-12-07 NOTE — Progress Notes (Signed)
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  * CAP- Streptococcus pneumonia bacteremia secondary to LUL  pneumonia (BC pos sensitive to pcn) Sepsis present on admission  resolved On IV ceftriaxone.  Change to ceftin for 2 weeks at discharge Off prednisone- no wheezing now Inhalers at discharge. -Continues over the counter cough medications. Flexeril as needed for chest pain from coughing  *Mild hyponatremia. Improved. Received IV fluids  *Right upper lobe 13 mm GG nodular density. Seen by pulmonary Drew Lee) rec   outpatient follow-up and PET scan once pneumonia improves. And possible biopsy     04/13/2017 1st [place] [facility] office visit/ Drew Lee   Chief Complaint  Patient presents with  . pulmonary consult    referred by Drew Lee SOB post pna with pain on left side when takes a big deep breath wheezing at night   whereas previous doe = MMRC1 = can walk nl pace, flat grade, can't hurry or go uphills or steps s sob   Same as prior to  hosp   Pleuritic L Chest Pain is down to 2/10=  L post chest wall/ flank and LUQ ? Dermatomal? (no h/o rash or back injury) Alb use just once a week now and now maint rx rec Stop smoking Return for pfts/ cxr    05/11/2017  f/u ov/Drew Lee re:  GOLD I copd / f/u LUL pna/ RUL spn  Chief Complaint  Patient presents with  . Follow-up    PFT's done today. His breathing has improved and he states has not had to use his albuterol inhaler.   Dyspnea:  MMRC1 = can walk nl pace, flat grade, can't hurry or go uphills or steps s sob   Cough:  none Sleep: ok SABA use: only uses if over does it  CP almost  completely resolved/ just feels a little tight with deep insp L post chest  Re Only use your albuterol as a rescue medication to be used if you can't catch your breath by resting or doing a relaxed purse lip breathing pattern.  - The less you use it, the better it will work when you need it. - Ok to use up to 2 puffs  every 4 hours if you must but call for immediate appointment if use goes up over your usual need - Don't leave home without it !!  (think of it like the spare tire for your car)   Please remember to go to the  x-ray department downstairs in the basement  for your tests - we will call you with the results when they are available.     You have  Very mild copd which will not progress if you stop smoking now - good luck!      12/07/2017  f/u ov/Drew Lee re:   Copd GOLD I  Chief Complaint  Patient presents with  . Follow-up    rhinitis, cough with yellow sputum, sneezing x 2 wks. He rarely uses his albuterol inhaler.   Dyspnea:  Does push  mower / sometime sob pushing heavy objects Cough: min am mucus/ worse x 2 weeks with cold  Sleeping: on side/ 10 degrees electric bed  SABA use: rarely, esp if overdoes it  02: no     No obvious day to day or daytime variability or assoc mucus plugs or hemoptysis or cp or chest tightness, subjective wheeze or overt sinus or hb symptoms.   Sleeping as above  without nocturnal  or early am exacerbation  of respiratory  c/o's or need for noct saba. Also denies any obvious fluctuation of symptoms with weather or environmental changes or other aggravating or alleviating factors except as outlined above   No unusual exposure hx or h/o childhood pna/ asthma or knowledge of premature birth.  Current Allergies, Complete Past Medical History, Past Surgical History, Family History, and Social History were reviewed in [employer] record.  ROS  The following are not  active complaints unless bolded Hoarseness, sore throat, dysphagia, dental problems, itching, sneezing,  nasal congestion or discharge of excess mucus or purulent secretions, ear ache,   fever, chills, sweats, unintended wt loss or wt gain, classically pleuritic or exertional cp,  orthopnea pnd or arm/hand swelling  or leg swelling, presyncope, palpitations, abdominal pain, anorexia, nausea, vomiting, diarrhea  or change in bowel habits or change in bladder habits, change in stools or change in urine, dysuria, hematuria,  rash, arthralgias, visual complaints, headache, numbness, weakness or ataxia or problems with walking or coordination,  change in mood or  memory.        Current Meds  Medication Sig  . albuterol (PROVENTIL HFA;VENTOLIN HFA) 108 (90 BASE) MCG/ACT inhaler Inhale 2 puffs into the lungs every 6 (six) hours as needed for wheezing or shortness of breath.  . cyclobenzaprine (FEXMID) 7.5 MG tablet Take 1 tablet (7.5 mg total) by mouth 2 (two) times daily as needed for muscle spasms.  . folic acid (FOLVITE) 800 MCG tablet Take 800 mcg by mouth daily.  Chilton Si Tea, Camillia sinensis, (GREEN TEA EXTRACT) 150 MG CAPS Take 1 capsule by mouth daily.  Marland Kitchen lamoTRIgine (LAMICTAL) 100 MG tablet Take 100 mg by mouth daily.  . Multiple Vitamins-Minerals (MULTIVITAMIN WITH MINERALS) tablet Take 1 tablet by mouth daily.  . Omega 3 1000 MG CAPS Take 1,000 mg by mouth 2 (two) times daily.  . Red Yeast Rice 600 MG CAPS Take by mouth.  . Thiamine HCl (VITAMIN B-1) 250 MG tablet Take 250 mg by mouth daily.              Objective:   Physical Exam    amb wm nad   12/07/2017        160   05/11/2017         160   04/13/17 158 lb 12.8 oz (72 kg)  03/19/17 162 lb (73.5 kg)  03/16/10 179 lb 3.2 oz (81.3 kg)     Vital signs reviewed - Note on arrival 02 sats  100% on RA           HEENT: Poor dentition ,   and oropharynx. Nl external ear canals without cough reflex - mild bilateral non-specific  turbinate edema     NECK :  without JVD/Nodes/TM/ nl carotid upstrokes bilaterally   LUNGS: no acc muscle use,  Nl contour chest scattered insp/exp rhonchi  bilaterally without cough on insp or exp maneuvers   CV:  RRR  no s3 or murmur or increase in P2, and no edema  ABD:  soft and nontender with nl inspiratory excursion in the supine position. No bruits or organomegaly appreciated, bowel sounds nl  MS:  Nl gait/ ext warm without deformities, calf tenderness, cyanosis or clubbing No obvious joint restrictions   SKIN: warm and dry without lesions    NEURO:  alert, approp, nl sensorium with  no motor or cerebellar deficits apparent.             Assessment:

## 2017-12-07 NOTE — Assessment & Plan Note (Signed)
Spirometry 04/13/2017  FEV1 2.78 (81%)  Ratio 68 s rx prior and mild curvature   - PFT's  05/11/2017  FEV1 3.27 (101 % ) ratio 63  p 7 % improvement from saba p nothing prior to study with DLCO  79/89c % corrects to 77 % for alv volume  Mild curvature    Mild flare with uri, reviewed rx with saba prn, mucinex dm/ zpak if mucus stays discolored but most importantly stop smoking now (see separate a/p)

## 2017-12-07 NOTE — Assessment & Plan Note (Signed)
See CT 03/19/17 13 mm right upper lobe pulmonary nodule (GG). Initial follow-up with CT at 6-12 months is recommended to confirm persistence  - CT 09/17/2017 1. Resolved left upper lobe pneumonia with residual small postinfectious/postinflammatory scar. 2. Resolved right upper lobe 1.3 cm subsolid pulmonary nodule, compatible with resolved inflammatory nodule. 3. Solid right middle lobe 3 mm pulmonary nodule, for which 6 month stability has been demonstrated, considered benign. 4. Severe centrilobular and paraseptal emphysema with diffuse bronchial wall thickening, suggesting COPD   Entire clinical picture c/w strep pneumo/ bacteremia with slow radiographic resolution/ min residual scarring   He is candidate for LDSCT but not ready to commit to this yet - no directed f/u for above finding needed and can address setting up screening schedule in 6 m

## 2017-12-07 NOTE — Patient Instructions (Addendum)
If mucus doesn't turn clear > zpak   For cough / cold  > mucinex dm 1200 mg every 12 hours as needed  Keep up with your dentist's recs   The key is to stop smoking completely before smoking completely stops you!   Please schedule a follow up visit in 6  months but call sooner if needed with PFTs on return

## 2018-03-27 ENCOUNTER — Telehealth: Payer: Self-pay | Admitting: Internal Medicine

## 2018-03-27 NOTE — Telephone Encounter (Signed)
Called and left message for Patient to call back. 

## 2018-03-28 NOTE — Telephone Encounter (Signed)
Called and left message for Patient to call back. 

## 2018-03-29 NOTE — Telephone Encounter (Signed)
Called and left message for Patient to call back. 

## 2018-04-01 NOTE — Telephone Encounter (Signed)
Called and spoke with Patient.  He stated that his Wife had a question.  Wife, Sharl Ma, stated that Patient was going to have MRI at of his arm, for cyst.  She is concerned that it is cancer and it may be related to his lung nodule. She was concerned that any other providers may not be able to see his CT chest that showed he has a lung nodule.  Explained that if they have access to Epic, they will see the same records that we have.  Understanding stated.  Nothing further at this time.

## 2018-05-30 ENCOUNTER — Telehealth: Payer: Self-pay | Admitting: Internal Medicine

## 2018-06-05 NOTE — Telephone Encounter (Signed)
ERROR

## 2018-06-10 ENCOUNTER — Ambulatory Visit: Payer: 59 | Admitting: Internal Medicine

## 2018-06-17 ENCOUNTER — Ambulatory Visit: Payer: 59 | Admitting: Internal Medicine

## 2019-01-11 IMAGING — CT CT ANGIO CHEST
2 of 6 series · 18 of 46 positions shown · IV contrast (APPLIED)
Comparison: None.

CLINICAL DATA: Shortness of breath, body aches

EXAM:
CT ANGIOGRAPHY CHEST WITH CONTRAST
TECHNIQUE: Multidetector CT imaging of the chest was performed using the
standard protocol during bolus administration of intravenous
contrast. Multiplanar CT image reconstructions and MIPs were
obtained to evaluate the vascular anatomy.
CONTRAST:  75mL [id number] IOPAMIDOL ([id number]) INJECTION 76%

[Series 5: thins · axial · 0.73mm/px · z∈[-679,-388]mm · 15 of 319 slices shown]
[im 14/319  lung]
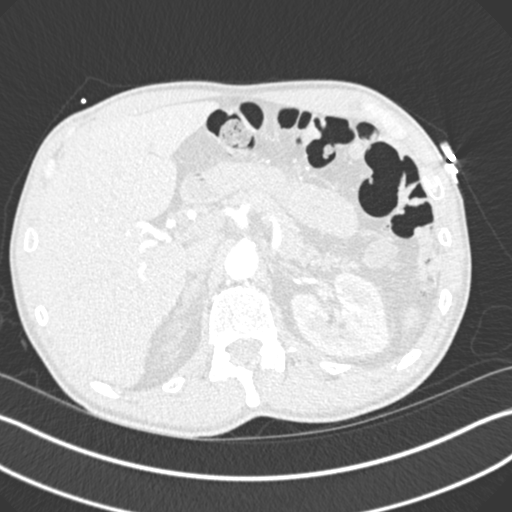
[im 42/319  soft-tissue]
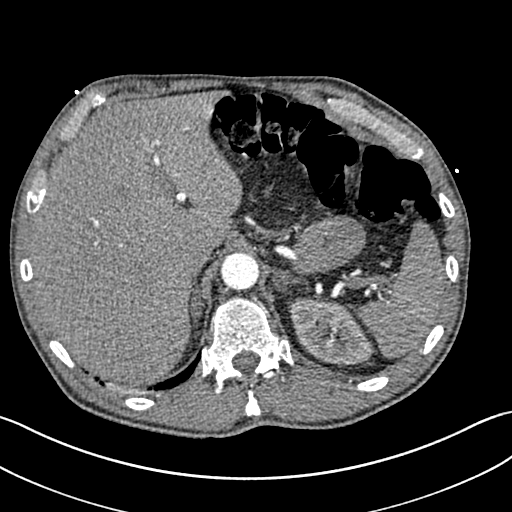
[im 56/319  lung]
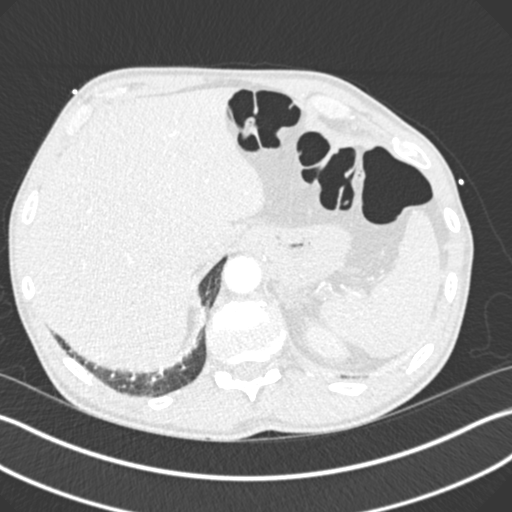
[im 83/319  soft-tissue]
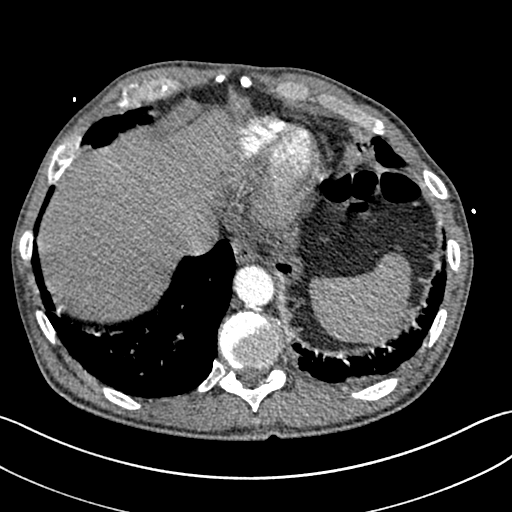
[im 97/319  lung]
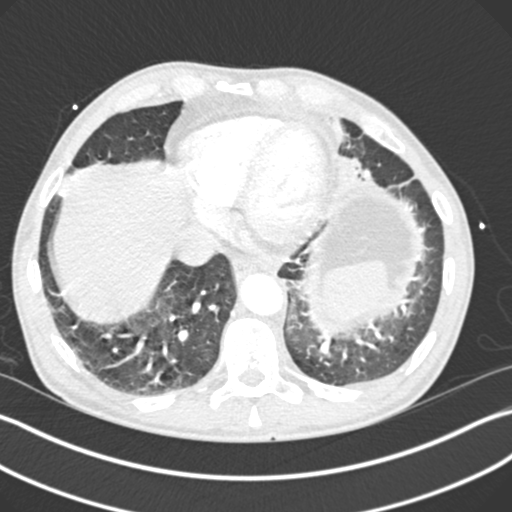
[im 125/319  soft-tissue]
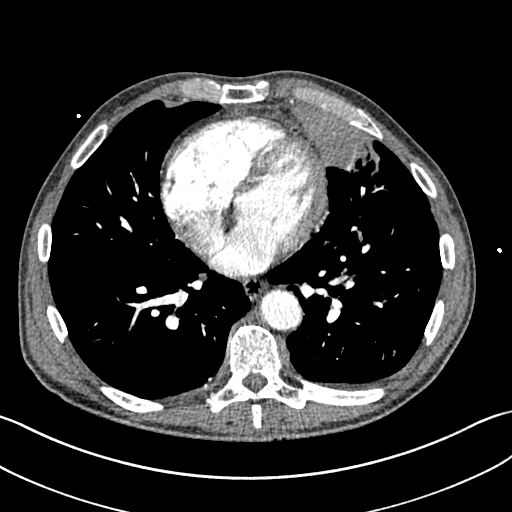
[im 139/319  lung]
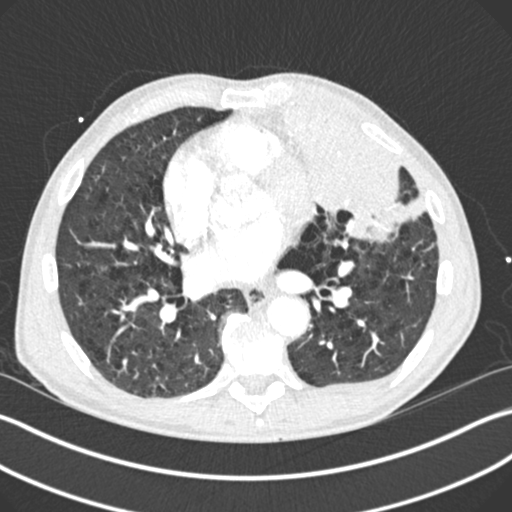
[im 166/319  soft-tissue]
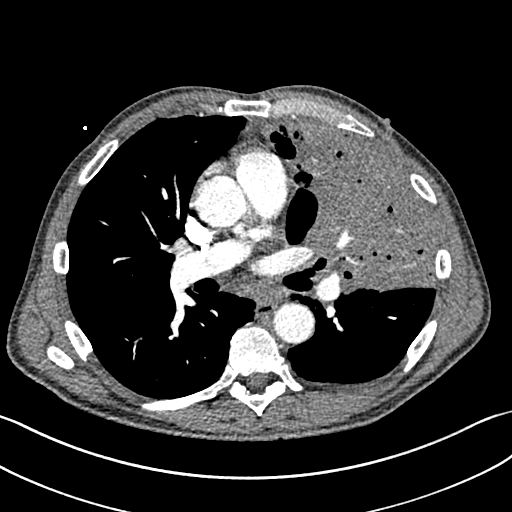
[im 180/319  lung]
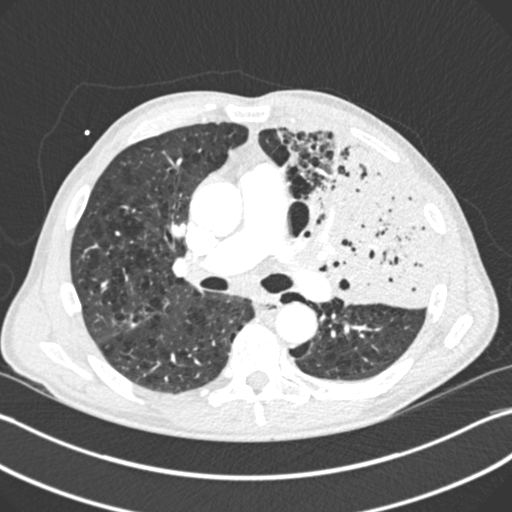
[im 194/319  soft-tissue]
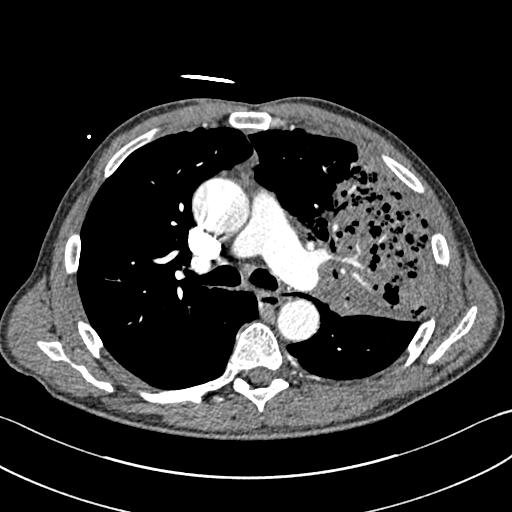
[im 222/319  lung]
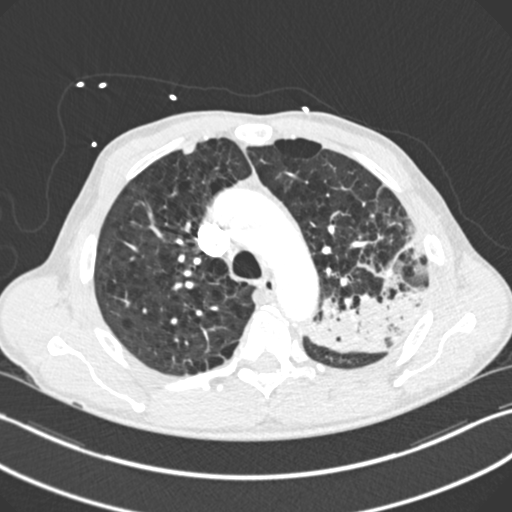
[im 236/319  soft-tissue]
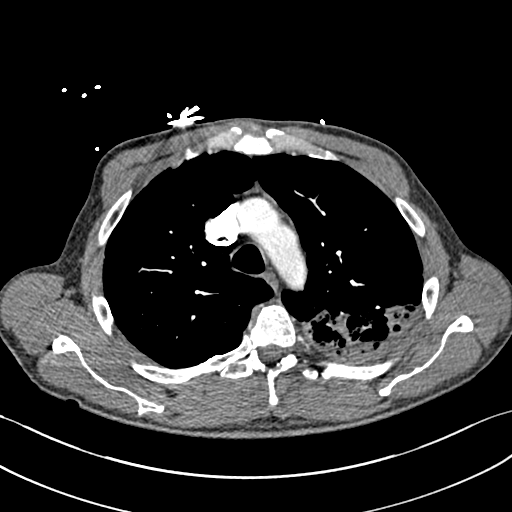
[im 263/319  lung]
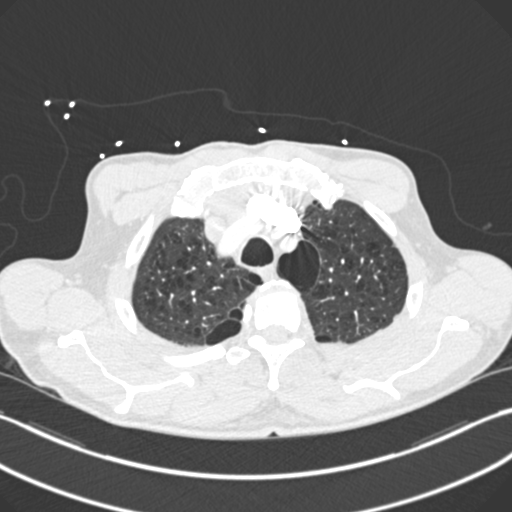
[im 277/319  soft-tissue]
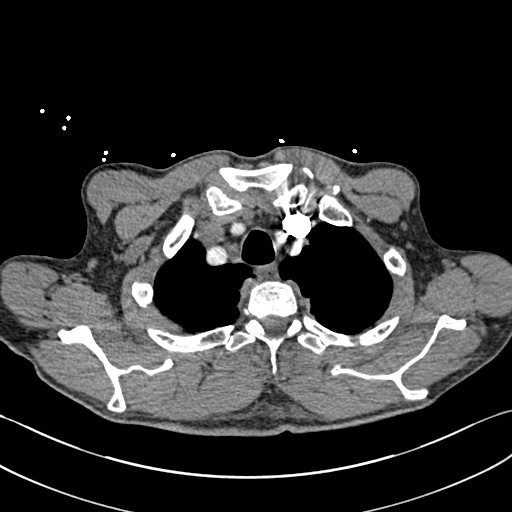
[im 305/319  lung]
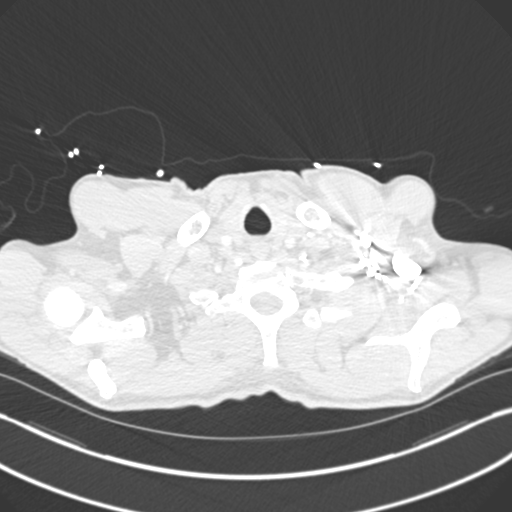

[Series 7: coronal mpr · coronal · 0.62mm/px · 3 of 90 slices shown]
[im 23/90  soft-tissue]
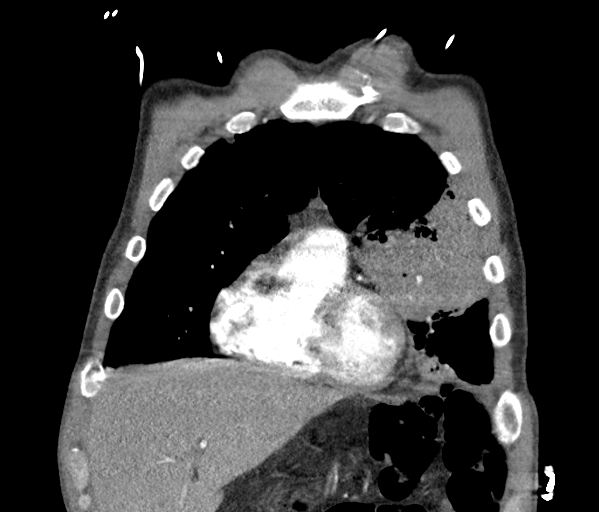
[im 45/90  soft-tissue]
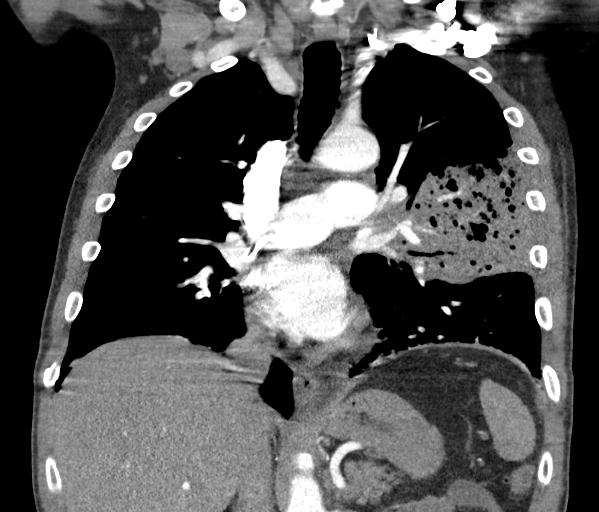
[im 67/90  soft-tissue]
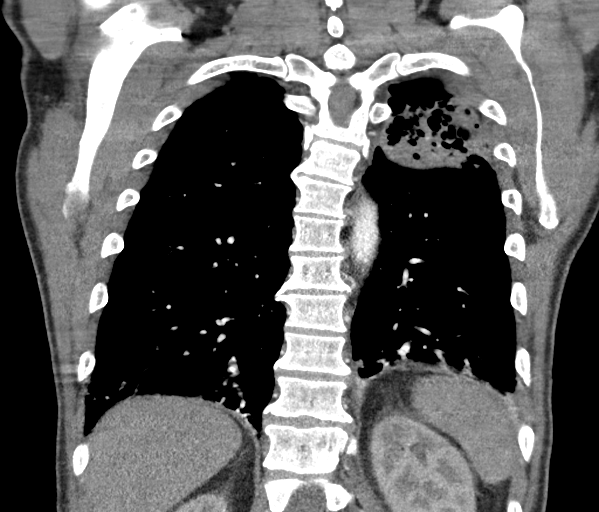

[18 of 46 positions shown; findings below may reference images not displayed]

FINDINGS: Cardiovascular: No filling defects in the pulmonary arteries to
suggest pulmonary emboli. Insert Heart

Mediastinum/Nodes: Borderline sized mediastinal lymph nodes, likely
reactive. Index prevascular lymph node has a short axis diameter of
9 mm. No axillary adenopathy or visible hilar adenopathy.

Lungs/Pleura: Dense consolidation noted in the left upper lobe
compatible with pneumonia. Moderate underlying centrilobular and
paraseptal emphysema.

Ground-glass nodular density in the right upper lobe measures 13 mm.
Trace left pleural effusion.

Upper Abdomen: Imaging into the upper abdomen shows no acute
findings.

Musculoskeletal: Chest wall soft tissues are unremarkable. No acute
bony abnormality.

Review of the MIP images confirms the above findings.
IMPRESSION: Dense consolidation throughout much of the left upper lobe
compatible with pneumonia.

Moderate underlying centrilobular and paraseptal emphysema.

13 mm right upper lobe pulmonary nodule. Initial follow-up with CT
at 6-12 months is recommended to confirm persistence. If persistent,
repeat CT is recommended every 2 years until 5 years of stability
has been established. This recommendation follows the consensus
statement: Guidelines for Management of Incidental Pulmonary Nodules
Detected on CT Images: From the [HOSPITAL] 8422; Radiology
8422; [DATE].

No evidence of pulmonary embolus.

## 2023-06-01 ENCOUNTER — Other Ambulatory Visit: Payer: Self-pay | Admitting: Nurse Practitioner

## 2023-06-01 DIAGNOSIS — Z720 Tobacco use: Secondary | ICD-10-CM

## 2023-07-09 ENCOUNTER — Other Ambulatory Visit

## 2023-07-16 ENCOUNTER — Ambulatory Visit
Admission: RE | Admit: 2023-07-16 | Discharge: 2023-07-16 | Disposition: A | Discharge status: Home / Self Care | Source: Ambulatory Visit | Attending: Nurse Practitioner | Admitting: Nurse Practitioner

## 2023-07-16 DIAGNOSIS — Z720 Tobacco use: Secondary | ICD-10-CM

## 2023-08-14 ENCOUNTER — Ambulatory Visit: Payer: Self-pay

## 2023-08-14 NOTE — Telephone Encounter (Signed)
 FYI Only or Action Required?: FYI only for provider  Called Nurse Triage reporting Shortness of Breath and coughing up blood. Symptoms began several months ago. Interventions attempted: Rescue inhaler, Increased fluids/rest, and Other: patient's wife calling to make a new patient appointment.. Symptoms are: unchanged.  Triage Disposition: Go to ED Now (Notify PCP)  Patient/caregiver understands and will follow disposition?: No, refuses disposition  Copied from CRM [id number]. Topic: Clinical - Red Word Triage >> Aug 14, 2023 11:27 AM Ambrose Junk wrote: [facility] that prompted transfer to Nurse Triage: lung nodules, diff breathing, coughing blood Reason for Disposition  [1] MODERATE difficulty breathing (e.g., speaks in phrases, SOB even at rest, pulse 100-120) AND [2] NEW-onset or WORSE than normal  Answer Assessment - Initial Assessment Questions 1. RESPIRATORY STATUS: "Describe your breathing?" (e.g., wheezing, shortness of breath, unable to speak, severe coughing)      Shortness of breath, severe coughing 2. ONSET: "When did this breathing problem begin?"      Shortness of breath has been going on for a couple of months 3. PATTERN "Does the difficult breathing come and go, or has it been constant since it started?"      constant 4. SEVERITY: "How bad is your breathing?" (e.g., mild, moderate, severe)    - MILD: No SOB at rest, mild SOB with walking, speaks normally in sentences, can lie down, no retractions, pulse < 100.    - MODERATE: SOB at rest, SOB with minimal exertion and prefers to sit, cannot lie down flat, speaks in phrases, mild retractions, audible wheezing, pulse 100-120.    - SEVERE: Very SOB at rest, speaks in single words, struggling to breathe, sitting hunched forward, retractions, pulse > 120      Moderate but depends on what he is doing 5. RECURRENT SYMPTOM: "Have you had difficulty breathing before?" If Yes, ask: "When was the last time?" and "What happened that time?"       yes 6. CARDIAC HISTORY: "Do you have any history of heart disease?" (e.g., heart attack, angina, bypass surgery, angioplasty)      Chest pain 7. LUNG HISTORY: "Do you have any history of lung disease?"  (e.g., pulmonary embolus, asthma, emphysema)     smoker 8. CAUSE: "What do you think is causing the breathing problem?"      CT for lung CA is suspicious  9. OTHER SYMPTOMS: "Do you have any other symptoms? (e.g., dizziness, runny nose, cough, chest pain, fever)     Runny nose 10. O2 SATURATION MONITOR:  "Do you use an oxygen saturation monitor (pulse oximeter) at home?" If Yes, ask: "What is your reading (oxygen level) today?" "What is your usual oxygen saturation reading?" (e.g., 95%)       No oxygen saturation monitor available 12. TRAVEL: "Have you traveled out of the country in the last month?" (e.g., travel history, exposures)       no  Patient has been seen by PCP for symptoms for the last couple of months. Patient was seen by pulmonary back in 2020.   Patient was treated for pneumonia a few months ago by PCP-was coughing up blood on and off since then. Reports coughing up dark red blood.  More shortness of breath with activity where he needs to keep his inhaler with him. Reports no issues with using inhaler. Wife states symptoms haven't increased.  ED recommended based on symptoms. Patient is refusing ED at this time. Needing a new patient appointment for pulmonary which was made. Encouraged patient's  wife to get a pulse oximeter to be able to monitor patient's oxygen levels. Education given for when patient should be taken to ED.  Protocols used: Breathing Difficulty-A-AH

## 2023-08-15 ENCOUNTER — Ambulatory Visit (INDEPENDENT_AMBULATORY_CARE_PROVIDER_SITE_OTHER): Admitting: Student in an Organized Health Care Education/Training Program

## 2023-08-15 ENCOUNTER — Telehealth: Payer: Self-pay

## 2023-08-15 ENCOUNTER — Encounter: Payer: Self-pay | Admitting: Student in an Organized Health Care Education/Training Program

## 2023-08-15 VITALS — BP 120/78 | HR 83 | Temp 97.1°F | Ht 68.0 in | Wt 157.6 lb

## 2023-08-15 DIAGNOSIS — R911 Solitary pulmonary nodule: Secondary | ICD-10-CM | POA: Diagnosis not present

## 2023-08-15 DIAGNOSIS — F17210 Nicotine dependence, cigarettes, uncomplicated: Secondary | ICD-10-CM

## 2023-08-15 DIAGNOSIS — J432 Centrilobular emphysema: Secondary | ICD-10-CM

## 2023-08-15 HISTORY — DX: Solitary pulmonary nodule: R91.1

## 2023-08-15 NOTE — Telephone Encounter (Signed)
 Robotic Bronchoscopy with EBUS 08/21/2022 at 8:00am Lung Nodule 31627, 31652, [id number]  Drew Lee please see Bronch info.  Patient is aware of date and time. Bronch email has been sent.

## 2023-08-15 NOTE — Progress Notes (Signed)
 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 While the patient was ill with an URTI in March, there is temporal separation between symptoms and the CT, and it's unlikely that both represent sequelae of infection. It is also less likely that both represent synchronous primary lung malignancies. Given his age, smoking history, and location of the nodules, I am concerned that one of them would be malignant and will work them up further with a PET/CT to assess for FDG avidity. We have also discussed biopsy and will obtain an updated chest CT for planning purposes that will allow us  to re-evaluate the nodules. Should either nodules persist and/or show FDG avidity, will proceed with robotic assisted navigational bronchoscopy to establish a diagnosis.  We discussed the importance of diagnosis and staging in lung malignancies, and the approach to obtaining a tissue diagnosis which would include robotic assisted navigational bronchoscopy with endobronchial ultrasound guided sampling.  We also discussed the risks associated with the procedure which include a 2% risk of pneumothorax, infection, bleeding, and nondiagnostic procedure in detail.  I explained that patients typically are able to return home the same day of the procedure, but in rare cases admission to the hospital for observation and treatment is required.  After our discussion, the patient elected to proceed with the procedure  Recommendations:  - Pulmonary Function Test; Future - NM PET Image Initial  (PI) Skull Base To Thigh (F-18 FDG); Future - CT SUPER D CHEST WO CONTRAST; Future - Procedural/ Surgical Case Request: ROBOTIC ASSISTED NAVIGATIONAL BRONCHOSCOPY, ENDOBRONCHIAL ULTRASOUND (EBUS); Future  #COPD  Patient with a history of COPD and CT imaging notable for significant and extensive emphysema. Will obtain updated PFT's and re-evaluate symptoms after biopsy. He will likely require the initiation of LABA/LAMA therapy for his symptoms. I also discussed smoking cessation and recommended he quit.  - Pulmonary Function Test; Future  Return in about 4 weeks (around 09/12/2023).  I spent 60 minutes caring for this patient today, including preparing to see the patient, obtaining a medical history , reviewing a separately obtained history, performing a medically appropriate examination and/or evaluation, counseling and educating the patient/family/caregiver, ordering medications, tests, or procedures, documenting clinical information in the electronic health record, and independently interpreting results (not separately reported/billed) and communicating results to the patient/family/caregiver  Drew Glasgow, MD [facility] 08/15/2023 10:41 AM    End of visit medications:  No orders of the defined types were placed in this encounter.    Current Outpatient Medications:    albuterol  (PROVENTIL  HFA;VENTOLIN  HFA) 108 (90 BASE) MCG/ACT inhaler, Inhale 2 puffs into the lungs every 6 (six) hours as needed for wheezing or shortness of breath., Disp: , Rfl:    atorvastatin (LIPITOR) 10 MG tablet, Take 10 mg by mouth daily., Disp: , Rfl:    cyclobenzaprine  (FEXMID ) 7.5 MG tablet, Take 1 tablet (7.5 mg total) by mouth 2 (two) times daily as needed for muscle spasms., Disp: 20 tablet, Rfl: 0   Green Tea, Camillia sinensis, (GREEN TEA EXTRACT) 150 MG CAPS, Take 1 capsule by mouth  daily., Disp: , Rfl:    lamoTRIgine  (LAMICTAL ) 100 MG tablet, Take 100 mg by mouth daily., Disp: ,  Rfl:    Multiple Vitamins-Minerals (MULTIVITAMIN WITH MINERALS) tablet, Take 1 tablet by mouth daily., Disp: , Rfl:    nitroGLYCERIN (NITROSTAT) 0.4 MG SL tablet, Place 0.4 mg under the tongue every 5 (five) minutes as needed., Disp: , Rfl:    Omega 3 1000 MG CAPS, Take 1,000 mg by mouth 2 (two) times daily., Disp: , Rfl:    Red Yeast Rice 600 MG CAPS, Take by mouth., Disp: , Rfl:    Thiamine  HCl (VITAMIN B-1) 250 MG tablet, Take 250 mg by mouth daily., Disp: , Rfl:    azithromycin  (ZITHROMAX ) 250 MG tablet, Take 2 on day one then 1 daily x 4 days (Patient not taking: Reported on 08/15/2023), Disp: 6 tablet, Rfl: 0   folic acid  (FOLVITE ) 800 MCG tablet, Take 800 mcg by mouth daily. (Patient not taking: Reported on 08/15/2023), Disp: , Rfl:    Subjective:   PATIENT ID: Drew Lee GENDER: male DOB: 1956-01-27, MRN: [id number]  Chief Complaint  Patient presents with   Consult    Nodule.     HPI  Patient is a pleasant 68 year old male presenting to clinic for the evaluation of pulmonary nodules.  He reports symptoms of exertional dyspnea as well as a cough.  This was quite noticeable in March of 2025 when he developed a productive cough that was treated with a course of steroids, Augmentin and doxycycline. His symptoms improved but he continued to cough.  On further questioning, the cough appears to be productive of phlegm with a green-tan color.  He also reports some epistaxis over the past month.  Patient did report previously coughing up phlegm with streaks of blood in them.  This has now resolved.  Patient was also given an inhaler (albuterol ) that he uses infrequently with some benefit.  At this point, his main symptom is that of dyspnea with significant exertion (such as when mowing the lawn).  Patient is followed by his primary care physician and a low-dose chest CT was ordered for lung cancer screening.  This was performed early May, 2025.  The CT was noted for 2 nodules that were  suspicious, 1 in the left upper lobe and another in the right upper lobe.  Patient is referred to us  for evaluation of said nodules and for consideration of biopsy.  Patient has a significant smoking history, currently smoking 1-1/2 packs/day.  He smoked between 1 and 1-1/2 packs/day since age 66.  He probably has around 75 pack years of smoking history.  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  He does get exposed to metal fumes in his line of work.  Ancillary information including prior medications, full medical/surgical/family/social histories, and PFTs (when available) are listed below and have been reviewed.   Review of Systems  Constitutional:  Negative for chills, fever and weight loss.  Respiratory:  Positive for cough, sputum production and shortness of breath. Negative for hemoptysis and wheezing.   Cardiovascular:  Negative for chest pain.     Objective:   Vitals:   08/15/23 0902  BP: 120/78  Pulse: 83  Temp: (!) 97.1 F (36.2 C)  SpO2: 97%  Weight: 157 lb 9.6 oz (71.5 kg)  Height: 5' 8 (1.727 m)   97% on RA  BMI Readings from Last 3 Encounters:  08/15/23 23.96 kg/m  12/07/17 24.75 kg/m  05/11/17 24.69 kg/m   Wt Readings from Last 3 Encounters:  08/15/23 157 lb 9.6 oz (71.5 kg)  12/07/17 160 lb 6.4 oz (72.8 kg)  05/11/17 160 lb (72.6 kg)    Physical Exam Constitutional:      Appearance: Normal appearance.  Cardiovascular:     Rate and Rhythm: Normal rate and regular rhythm.     Pulses: Normal pulses.     Heart sounds: Normal heart sounds.  Pulmonary:     Effort: Pulmonary effort is normal.     Breath sounds: Normal breath sounds.  Neurological:     General: No focal deficit present.     Mental Status: He is alert and oriented to person, place, and time. Mental status is at baseline.       Ancillary Information    Past Medical History:   Diagnosis Date   Anxiety    Asthma    as child   Bipolar 1 disorder (HCC)    Depression    ED (erectile dysfunction)    ETOH abuse    Hyperlipidemia    Pneumonia      Family History  Problem Relation Age of Onset   Kidney failure Mother    Heart attack Father      Past Surgical History:  Procedure Laterality Date   COLONOSCOPY     TONSILLECTOMY      Social History   Socioeconomic History   Marital status: Married    Spouse name: Not on file   Number of children: Not on file   Years of education: Not on file   Highest education level: Not on file  Occupational History   Not on file  Tobacco Use   Smoking status: Every Day    Current packs/day: 1.50    Average packs/day: 1.5 packs/day for 45.0 years (67.5 ttl pk-yrs)    Types: Cigarettes   Smokeless tobacco: Former    Types: Chew   Tobacco comments:    1.5 PPD khj - 08/15/2023        Started smoking at 68 years old.    Smoked 2 PPD at his heaviest.  Vaping Use   Vaping status: Never Used  Substance and Sexual Activity   Alcohol use: Yes    Alcohol/week: 4.0 standard drinks of alcohol    Types: 4 Cans of beer per week    Comment: after work   Drug use: No   Sexual activity: Yes  Other Topics Concern   Not on file  Social History Narrative   Not on file   Social Drivers of Health   Financial Resource Strain: Not on file  Food Insecurity: Not on file  Transportation Needs: Not on file  Physical Activity: Not on file  Stress: Not on file  Social Connections: Unknown (07/19/2021)   [facility]   Social Network    Social Network: Not on file  Intimate Partner Violence: Unknown (06/09/2021)   Received from [facility]   HITS    Physically Hurt: Not on file    Insult or Talk Down To: Not on file    Threaten Physical Harm: Not on file    Scream or Curse: Not on file     Allergies  Allergen Reactions   Magnesium-Containing Compounds Hives   Sulfa Antibiotics Hives   Sulfonamide  Derivatives     REACTION: hives   Viagra [Sildenafil Citrate]  Blurred vision     CBC    Component Value Date/Time   WBC 16.8 (H) 03/21/2017 0330   RBC 3.49 (L) 03/21/2017 0330   HGB 11.3 (L) 03/21/2017 0330   HCT 33.4 (L) 03/21/2017 0330   PLT 349 03/21/2017 0330   MCV 95.8 03/21/2017 0330   MCH 32.5 03/21/2017 0330   MCHC 33.9 03/21/2017 0330   RDW 14.5 03/21/2017 0330   LYMPHSABS 1.1 03/21/2017 0330   MONOABS 0.7 03/21/2017 0330   EOSABS 0.0 03/21/2017 0330   BASOSABS 0.1 03/21/2017 0330    Pulmonary Functions Testing Results:    Latest Ref Rng & Units 05/11/2017    9:54 AM  PFT Results  FVC-Pre L 4.90   FVC-Predicted Pre % 114   FVC-Post L 5.19   FVC-Predicted Post % 121   Pre FEV1/FVC % % 62   Post FEV1/FCV % % 63   FEV1-Pre L 3.03   FEV1-Predicted Pre % 94   FEV1-Post L 3.27   DLCO uncorrected ml/min/mmHg 22.86   DLCO UNC% % 79   DLCO corrected ml/min/mmHg 25.61   DLCO COR %Predicted % 89   DLVA Predicted % 77   TLC L 8.56   TLC % Predicted % 132   RV % Predicted % 154     Outpatient Medications Prior to Visit  Medication Sig Dispense Refill   albuterol  (PROVENTIL  HFA;VENTOLIN  HFA) 108 (90 BASE) MCG/ACT inhaler Inhale 2 puffs into the lungs every 6 (six) hours as needed for wheezing or shortness of breath.     atorvastatin (LIPITOR) 10 MG tablet Take 10 mg by mouth daily.     cyclobenzaprine  (FEXMID ) 7.5 MG tablet Take 1 tablet (7.5 mg total) by mouth 2 (two) times daily as needed for muscle spasms. 20 tablet 0   Green Tea, Camillia sinensis, (GREEN TEA EXTRACT) 150 MG CAPS Take 1 capsule by mouth daily.     lamoTRIgine  (LAMICTAL ) 100 MG tablet Take 100 mg by mouth daily.     Multiple Vitamins-Minerals (MULTIVITAMIN WITH MINERALS) tablet Take 1 tablet by mouth daily.     nitroGLYCERIN (NITROSTAT) 0.4 MG SL tablet Place 0.4 mg under the tongue every 5 (five) minutes as needed.     Omega 3 1000 MG CAPS Take 1,000 mg by mouth 2 (two) times daily.     Red  Yeast Rice 600 MG CAPS Take by mouth.     Thiamine  HCl (VITAMIN B-1) 250 MG tablet Take 250 mg by mouth daily.     azithromycin  (ZITHROMAX ) 250 MG tablet Take 2 on day one then 1 daily x 4 days (Patient not taking: Reported on 08/15/2023) 6 tablet 0   folic acid  (FOLVITE ) 800 MCG tablet Take 800 mcg by mouth daily. (Patient not taking: Reported on 08/15/2023)     No facility-administered medications prior to visit.

## 2023-08-15 NOTE — Progress Notes (Signed)
 Please schedule the following:  Provider performing procedure:Jaykob Minichiello Diagnosis: lung nodule Which side if for nodule / mass? bilateral Procedure: robotic assisted navigational bronchoscopy, EBUS  Has patient been spoken to by Provider and given informed consent? yes Anesthesia: general Do you need Fluro? Yes, GE 3D Duration of procedure: 1.5 hours Date: 08/21/2023 Location: [facility] Does patient have OSA? no DM? no Or Latex allergy? no Medication Restriction/ Anticoagulate/Antiplatelet: no Pre-op Labs Ordered:determined by Anesthesia Imaging request: ordered  (If, SuperDimension CT Chest, please have STAT courier sent to ENDO)

## 2023-08-17 ENCOUNTER — Ambulatory Visit
Admission: RE | Admit: 2023-08-17 | Discharge: 2023-08-17 | Disposition: A | Discharge status: Home / Self Care | Source: Ambulatory Visit | Attending: Student in an Organized Health Care Education/Training Program | Admitting: Student in an Organized Health Care Education/Training Program

## 2023-08-17 ENCOUNTER — Encounter
Admission: RE | Admit: 2023-08-17 | Discharge: 2023-08-17 | Disposition: A | Discharge status: Home / Self Care | Source: Ambulatory Visit | Attending: Student in an Organized Health Care Education/Training Program

## 2023-08-17 ENCOUNTER — Other Ambulatory Visit

## 2023-08-17 ENCOUNTER — Other Ambulatory Visit: Payer: Self-pay

## 2023-08-17 VITALS — Ht 68.0 in | Wt 157.6 lb

## 2023-08-17 DIAGNOSIS — J449 Chronic obstructive pulmonary disease, unspecified: Secondary | ICD-10-CM | POA: Diagnosis present

## 2023-08-17 DIAGNOSIS — R911 Solitary pulmonary nodule: Secondary | ICD-10-CM

## 2023-08-17 DIAGNOSIS — J13 Pneumonia due to Streptococcus pneumoniae: Secondary | ICD-10-CM | POA: Insufficient documentation

## 2023-08-17 DIAGNOSIS — Z01810 Encounter for preprocedural cardiovascular examination: Secondary | ICD-10-CM

## 2023-08-17 DIAGNOSIS — Z01812 Encounter for preprocedural laboratory examination: Secondary | ICD-10-CM | POA: Diagnosis present

## 2023-08-17 LAB — GLUCOSE, CAPILLARY: Glucose-Capillary: 105 mg/dL — ABNORMAL HIGH (ref 70–99)

## 2023-08-17 MED ORDER — FLUDEOXYGLUCOSE F - 18 (FDG) INJECTION
8.8400 | Freq: Once | INTRAVENOUS | Status: AC | PRN
  Administered 2023-08-17: 8.84 via INTRAVENOUS

## 2023-08-17 NOTE — Patient Instructions (Addendum)
 Your procedure is scheduled on: TUESDAY JUNE 17  Report to the Registration Desk on the 1st floor of the [facility] To find out your arrival time, please call [phone] between 1PM - 3PM on:  MONDAY JUNE 16  If your arrival time is 6:00 am, do not arrive before that time as the [facility] entrance doors do not open until 6:00 am.  REMEMBER: Instructions that are not followed completely may result in serious medical risk, up to and including death; or upon the discretion of your surgeon and anesthesiologist your surgery may need to be rescheduled.  Do not eat food after midnight the night before surgery.  No gum chewing or hard candies.  One week prior to surgery: Stop Anti-inflammatories (NSAIDS) such as Advil, Aleve, Ibuprofen, Motrin, Naproxen, Naprosyn and Aspirin based products such as Excedrin, Goody's Powder, BC Powder. Stop ANY OVER THE COUNTER supplements until after surgery. folic acid  (FOLVITE )  Green Tea, Camillia sinensis, (GREEN TEA EXTRACT)  Multiple Vitamins-Minerals (MULTIVITAMIN WITH MINERALS)  Omega 3  Red Yeast Rice  Thiamine  HCl (VITAMIN B-1)  You may however, continue to take Tylenol  if needed for pain up until the day of surgery.    Continue taking all of your other prescription medications up until the day of surgery.  ON THE DAY OF SURGERY DO NOT TAKE ANY MEDICATIONS   Use inhalers on the day of surgery and bring to the hospital. albuterol  (PROVENTIL  HFA;VENTOLIN  HFA )   No Alcohol for 24 hours before or after surgery.  No Smoking including e-cigarettes for 24 hours before surgery.  No chewable tobacco products for at least 6 hours before surgery.  No nicotine  patches on the day of surgery.  Do not use any recreational drugs for at least a week (preferably 2 weeks) before your surgery.  Please be advised that the combination of cocaine and anesthesia may have negative outcomes, up to and including death. If you test positive for cocaine, your  surgery will be cancelled.  On the morning of surgery brush your teeth with toothpaste and water, you may rinse your mouth with mouthwash if you wish. Do not swallow any toothpaste or mouthwash.  Do not wear jewelry, make-up, hairpins, clips or nail polish.  For welded (permanent) jewelry: bracelets, anklets, waist bands, etc.  Please have this removed prior to surgery.  If it is not removed, there is a chance that hospital personnel will need to cut it off on the day of surgery.  Do not wear lotions, powders, or perfumes.   Do not shave body hair from the neck down 48 hours before surgery.  Contact lenses, hearing aids and dentures may not be worn into surgery.  Do not bring valuables to the hospital. [facility] is not responsible for any missing/lost belongings or valuables.   Notify your doctor if there is any change in your medical condition (cold, fever, infection).  Wear comfortable clothing (specific to your surgery type) to the hospital.  After surgery, you can help prevent lung complications by doing breathing exercises.  Take deep breaths and cough every 1-2 hours.  If you are being discharged the day of surgery, you will not be allowed to drive home. You will need a responsible individual to drive you home and stay with you for 24 hours after surgery.   If you are taking public transportation, you will need to have a responsible individual with you.  Please call the Pre-admissions Testing Dept. at [phone] if you have  any questions about these instructions.  Surgery Visitation Policy:  Patients having surgery or a procedure may have two visitors.  Children under the age of 42 must have an adult with them who is not the patient.

## 2023-08-20 ENCOUNTER — Ambulatory Visit

## 2023-08-21 ENCOUNTER — Encounter: Admission: RE | Payer: Self-pay | Source: Home / Self Care

## 2023-08-21 ENCOUNTER — Ambulatory Visit: Payer: Self-pay | Admitting: Student in an Organized Health Care Education/Training Program

## 2023-08-21 ENCOUNTER — Ambulatory Visit
Admission: RE | Admit: 2023-08-21 | Source: Home / Self Care | Admitting: Student in an Organized Health Care Education/Training Program

## 2023-08-21 DIAGNOSIS — R911 Solitary pulmonary nodule: Secondary | ICD-10-CM | POA: Insufficient documentation

## 2023-08-21 SURGERY — BRONCHOSCOPY, WITH BIOPSY USING ELECTROMAGNETIC NAVIGATION
Anesthesia: General | Laterality: Bilateral

## 2023-08-21 NOTE — Telephone Encounter (Signed)
 Lm x1 for the patient.

## 2023-08-21 NOTE — Telephone Encounter (Signed)
 Patient canceled Bronch on 08/21/2023. It has been rescheduled to 09/04/2023  Robotic Bronchoscopy with EBUS 09/04/2022 at 8:00am Lung Nodule 31627, 31652, 31653   Drew Lee please see Bronch info.

## 2023-08-22 ENCOUNTER — Other Ambulatory Visit: Payer: Self-pay | Admitting: Student in an Organized Health Care Education/Training Program

## 2023-08-22 ENCOUNTER — Telehealth: Payer: Self-pay

## 2023-08-22 DIAGNOSIS — J432 Centrilobular emphysema: Secondary | ICD-10-CM

## 2023-08-22 MED ORDER — AZITHROMYCIN 250 MG PO TABS: ORAL_TABLET | ORAL | 0 refills | Status: AC

## 2023-08-22 NOTE — Telephone Encounter (Signed)
 Copied from CRM [id number]. Topic: Clinical - Lab/Test Results >> Aug 22, 2023  8:45 AM Hilton Lucky wrote: Reason for CRM:  Patient's wife is calling to request a member of clinica staff call her to notify her of CT results. Requesting we call and leave a voicemail to let them know if patient has gotten better or worse or the same.   States patient is also coughing up phlegm and she would like us  to call in an antibiotic as well. [facility] [place], [place].   Requesting we also send a copy of imaging over to [facility].

## 2023-08-22 NOTE — Telephone Encounter (Signed)
 LM x1 for [place].

## 2023-08-22 NOTE — Telephone Encounter (Signed)
 Per secure chat with Dr. Darnelle Elders- thanks Merritt Ables. yes, the biopsy is still needed, especially for the LUL pulmonary nodule. Nodule is stable since may. but we don't expect it to grow much in a month. I spoke to him on the phone on Monday and told him so... do you mind calling him again and letting him know? the PET scan is showing activity in the left lung nodule, and that's what we should biopsy. As to the antibiotics, I don't mind sending a course of zpak, I just placed the order and sent it to his pharmacy for the zpak. Nodule is stable since may. but we don't expect it to grow much in a month.

## 2023-08-22 NOTE — Telephone Encounter (Signed)
 I spoke with the patient's wife (DPR). She is said they saw the results of the PET and CT scan come through on their Mychart. They are wanting to know if the nodules have gotten bigger and if he still needs to have the Bronchoscopy done? Also he is still coughing and now he is getting a lot of dark yellow sputum up. They are wanting to know if you can send in something for it? Malachi Screws asked that you call her cell phone back. I have placed it below.   Lestine Rathke phone [phone]

## 2023-08-23 NOTE — Telephone Encounter (Signed)
 Lm x2 for the patient.

## 2023-08-23 NOTE — Telephone Encounter (Addendum)
 I spoke with the patient. He said he is needing to cancel the Bronchoscopy that is scheduled on 7/1 and his breathing test that is scheduled for 6/25. He said his heart is bothering him more then his lungs and he wants to see about that first.   I have called and canceled the bronchoscopy and I have canceled the PFT.   Nothing further needed.

## 2023-08-23 NOTE — Telephone Encounter (Signed)
 I have notified Drew Lee. She asked me to call the patient to discuss his Bronch date and time.  See telephone message from 08/15/2023.  Nothing further needed.

## 2023-08-24 ENCOUNTER — Ambulatory Visit

## 2023-08-27 NOTE — Telephone Encounter (Signed)
 For the codes 68372, [id number], [id number] Auth # [id number] valid 08/21/23 to 11/19/2023

## 2023-08-29 ENCOUNTER — Encounter

## 2023-08-29 NOTE — Telephone Encounter (Signed)
 Noted. Nothing further needed.

## 2023-09-04 ENCOUNTER — Ambulatory Visit: Admit: 2023-09-04 | Admitting: Student in an Organized Health Care Education/Training Program

## 2023-09-04 SURGERY — BRONCHOSCOPY, WITH BIOPSY USING ELECTROMAGNETIC NAVIGATION
Anesthesia: General | Laterality: Bilateral

## 2023-09-05 ENCOUNTER — Telehealth: Payer: Self-pay | Admitting: *Deleted

## 2023-09-05 ENCOUNTER — Encounter: Payer: Self-pay | Admitting: *Deleted

## 2023-09-05 NOTE — Telephone Encounter (Signed)
 LMOVM to verify card hx.

## 2023-09-06 ENCOUNTER — Encounter: Payer: Self-pay | Admitting: Cardiovascular Disease

## 2023-09-06 ENCOUNTER — Ambulatory Visit: Attending: Cardiovascular Disease | Admitting: Cardiovascular Disease

## 2023-09-06 VITALS — BP 120/80 | HR 77 | Ht 68.0 in | Wt 156.5 lb

## 2023-09-06 DIAGNOSIS — Z720 Tobacco use: Secondary | ICD-10-CM

## 2023-09-06 DIAGNOSIS — R0602 Shortness of breath: Secondary | ICD-10-CM | POA: Diagnosis not present

## 2023-09-06 DIAGNOSIS — I25119 Atherosclerotic heart disease of native coronary artery with unspecified angina pectoris: Secondary | ICD-10-CM | POA: Diagnosis not present

## 2023-09-06 DIAGNOSIS — E785 Hyperlipidemia, unspecified: Secondary | ICD-10-CM

## 2023-09-06 DIAGNOSIS — R079 Chest pain, unspecified: Secondary | ICD-10-CM

## 2023-09-06 NOTE — Progress Notes (Signed)
 Cardiology Office Note   Date:  09/06/2023   ID:  Drew Lee, DOB 1955-03-13, MRN [id number]  PCP:  Barbra Odor, NP  Cardiologist:   Deatrice Cage, MD   Chief Complaint  Patient presents with   New Patient (Initial Visit)    CAD c/o sob and chest pain. Discuss CT/PET scan. Meds reviewed verbally with pt.      History of Present Illness: Drew Lee is a 68 y.o. male who was referred by Odor Barbra for evaluation of chest pain, coronary calcifications and exertional dyspnea.  He has known history of COPD and extensive tobacco use.  He smokes 2 packs/day and has been doing so for more than 50 years. CT cancer screening in May showed evidence of aortic atherosclerosis and three-vessel coronary artery calcifications.  Ascending aorta was mildly dilated at 4 cm.  He was seen recently by pulmonary for abnormal pulmonary nodules.  PET scan was abnormal and thus he was scheduled for bronchoscopy but he canceled due to his concerns about recent chest pain.  He had pneumonia recently and since then he experienced intermittent episodes of substernal chest pain described as dull and pressing sensation which lasts for few minutes and gets worse with exertion.  In addition, he has increased dyspnea.  No previous cardiac history or cardiac evaluation.  He does have known history of hyperlipidemia and he takes atorvastatin.  Past Medical History:  Diagnosis Date   Anxiety    Asthma    as child   Bipolar 1 disorder (HCC)    Depression    ED (erectile dysfunction)    ETOH abuse    Hyperlipidemia    Lung nodule 08/15/2023   Pneumonia     Past Surgical History:  Procedure Laterality Date   COLONOSCOPY     TONSILLECTOMY       Current Outpatient Medications  Medication Sig Dispense Refill   albuterol  (PROVENTIL  HFA;VENTOLIN  HFA) 108 (90 BASE) MCG/ACT inhaler Inhale 2 puffs into the lungs every 6 (six) hours as needed for wheezing or shortness of breath.     atorvastatin (LIPITOR)  10 MG tablet Take 10 mg by mouth daily.     cyclobenzaprine  (FEXMID ) 7.5 MG tablet Take 1 tablet (7.5 mg total) by mouth 2 (two) times daily as needed for muscle spasms. 20 tablet 0   Green Tea, Camillia sinensis, (GREEN TEA EXTRACT) 150 MG CAPS Take 1 capsule by mouth daily.     lamoTRIgine  (LAMICTAL ) 100 MG tablet Take 100 mg by mouth daily.     Multiple Vitamins-Minerals (MULTIVITAMIN WITH MINERALS) tablet Take 1 tablet by mouth daily.     nitroGLYCERIN (NITROSTAT) 0.4 MG SL tablet Place 0.4 mg under the tongue every 5 (five) minutes as needed.     Omega 3 1000 MG CAPS Take 1,000 mg by mouth 2 (two) times daily.     Red Yeast Rice 600 MG CAPS Take by mouth.     Thiamine  HCl (VITAMIN B-1) 250 MG tablet Take 250 mg by mouth daily.     No current facility-administered medications for this visit.    Allergies:   Magnesium-containing compounds, Sulfa antibiotics, Sulfonamide derivatives, and Viagra [sildenafil citrate]    Social History:  The patient  reports that he has been smoking cigarettes. He has a 67.5 pack-year smoking history. He has quit using smokeless tobacco.  His smokeless tobacco use included chew. He reports current alcohol use of about 4.0 standard drinks of alcohol per week. He reports that  he does not use drugs.   Family History:  The patient's family history includes Heart attack in his maternal grandfather; Kidney failure in his mother.    ROS:  Please see the history of present illness.   Otherwise, review of systems are positive for none.   All other systems are reviewed and negative.    PHYSICAL EXAM: VS:  BP 120/80 (BP Location: Right Arm, Cuff Size: Normal)   Pulse 77   Ht 5' 8 (1.727 m)   Wt 156 lb 8 oz (71 kg)   SpO2 97%   BMI 23.80 kg/m  , BMI Body mass index is 23.8 kg/m. GEN: Well nourished, well developed, in no acute distress  HEENT: normal  Neck: no JVD, carotid bruits, or masses Cardiac: RRR; no murmurs, rubs, or gallops,no edema  Respiratory:  Diminished breath sounds. GI: soft, nontender, nondistended, + BS MS: no deformity or atrophy  Skin: warm and dry, no rash Neuro:  Strength and sensation are intact Psych: euthymic mood, full affect   EKG:  EKG is not ordered today. The ekg ordered today demonstrates : Normal sinus rhythm Normal ECG When compared with ECG of 19-Mar-2017 08:11, Vent. rate has decreased BY  46 BPM T wave amplitude has increased in Inferior leads    Recent Labs: No results found for requested labs within last 365 days.    Lipid Panel No results found for: CHOL, TRIG, HDL, CHOLHDL, VLDL, LDLCALC, LDLDIRECT    Wt Readings from Last 3 Encounters:  09/06/23 156 lb 8 oz (71 kg)  08/17/23 157 lb 9.6 oz (71.5 kg)  08/15/23 157 lb 9.6 oz (71.5 kg)          09/06/2023   10:28 AM 09/05/2023    5:24 PM  PAD Screen  Previous PAD dx? No No  Previous surgical procedure? No No  Pain with walking? No No  Subsides with rest? No No  Feet/toe relief with dangling? No No  Painful, non-healing ulcers? No No  Extremities discolored? No No      ASSESSMENT AND PLAN:  1.  Exertional chest pain: Concerning for angina.  I reviewed the recent results of CT scan with him which showed significant coronary calcification.  Recommend evaluation with a treadmill nuclear stress test.  He prefers this over [place] but I explained to him that if he is not able to get his heart rate up, we will have to switch.  2.  Shortness of breath: Likely due to underlying COPD and continued smoking however, we have to evaluate his LV systolic function.  I requested an echocardiogram.  3.  Hyperlipidemia: I asked him to start taking atorvastatin daily instead of every other day.  4.  Tobacco use: I discussed with him the importance of smoking cessation.  5.  Lung mass: Concerning for cancer with plans for bronchoscopy in the near future.    Disposition:   FU after cardiac testing.   Signed,  Deatrice Cage,  MD  09/06/2023 10:38 AM    [facility]

## 2023-09-06 NOTE — Patient Instructions (Signed)
 Medication Instructions:  The current medical regimen is effective;  continue present plan and medications as directed. Please refer to the Current Medication list given to you today.   *If you need a refill on your cardiac medications before your next appointment, please call your pharmacy*   Testing/Procedures: Your provider has ordered a Lexiscan/ Exercise Myoview Stress test. This will take place at [facility]. Please report to the [facility] medical mall entrance. The volunteers at the first desk will direct you where to go.  [facility] MYOVIEW  Your provider has ordered a Stress Test with nuclear imaging. The purpose of this test is to evaluate the blood supply to your heart muscle. This procedure is referred to as a Non-Invasive Stress Test. This is because other than having an IV started in your vein, nothing is inserted or invades your body. Cardiac stress tests are done to find areas of poor blood flow to the heart by determining the extent of coronary artery disease (CAD). Some patients exercise on a treadmill, which naturally increases the blood flow to your heart, while others who are unable to walk on a treadmill due to physical limitations will have a pharmacologic/chemical stress agent called Lexiscan . This medicine will mimic walking on a treadmill by temporarily increasing your coronary blood flow.   Please note: these test may take anywhere between 2-4 hours to complete  How to prepare for your Myoview test:  Nothing to eat for 6 hours prior to the test No caffeine for 24 hours prior to test No smoking 24 hours prior to test. Your medication may be taken with water.  If your doctor stopped a medication because of this test, do not take that medication. Ladies, please do not wear dresses.  Skirts or pants are appropriate. Please wear a short sleeve shirt. No perfume, cologne or lotion. Wear comfortable walking shoes. No heels!   PLEASE NOTIFY THE OFFICE AT LEAST 24 HOURS IN ADVANCE IF  YOU ARE UNABLE TO KEEP YOUR APPOINTMENT.  [phone] AND  PLEASE NOTIFY NUCLEAR MEDICINE AT [facility] AT LEAST 24 HOURS IN ADVANCE IF YOU ARE UNABLE TO KEEP YOUR APPOINTMENT. [phone]    Your physician has requested that you have an echocardiogram. Echocardiography is a painless test that uses sound waves to create images of your heart. It provides your doctor with information about the size and shape of your heart and how well your heart's chambers and valves are working.   You may receive an ultrasound enhancing agent through an IV if needed to better visualize your heart during the echo. This procedure takes approximately one hour.  There are no restrictions for this procedure.  This will take place at [address] [facility] [address], [place] [address]  Please note: We ask at that you not bring children with you during ultrasound (echo/ vascular) testing. Due to room size and safety concerns, children are not allowed in the ultrasound rooms during exams. Our front office staff cannot provide observation of children in our lobby area while testing is being conducted. An adult accompanying a patient to their appointment will only be allowed in the ultrasound room at the discretion of the ultrasound technician under special circumstances. We apologize for any inconvenience.   Follow-Up: At [facility], you and your health needs are our priority.  As part of our continuing mission to provide you with exceptional heart care, our providers are all part of one team.  This team includes your primary Cardiologist (physician) and  Advanced Practice Providers or APPs (Physician Assistants and Nurse Practitioners) who all work together to provide you with the care you need, when you need it.  Your next appointment:   6 week(s)  Provider:   You will see one of the following Advanced Practice Providers on your designated Care Team:   Lonni Meager, NP Lesley Maffucci,  PA-C Bernardino Bring, PA-C Cadence La Belle, PA-C Tylene Lunch, NP Barnie Hila, NP   We recommend signing up for the patient portal called MyChart.  Sign up information is provided on this After Visit Summary.  MyChart is used to connect with patients for Virtual Visits (Telemedicine).  Patients are able to view lab/test results, encounter notes, upcoming appointments, etc.  Non-urgent messages can be sent to your provider as well.   To learn more about what you can do with MyChart, go to [email or web address].

## 2023-09-25 ENCOUNTER — Other Ambulatory Visit: Payer: Self-pay | Admitting: Physician Assistant

## 2023-09-25 ENCOUNTER — Ambulatory Visit: Payer: Self-pay | Admitting: Internal Medicine

## 2023-09-25 ENCOUNTER — Encounter
Admission: RE | Admit: 2023-09-25 | Discharge: 2023-09-25 | Disposition: A | Discharge status: Home / Self Care | Source: Ambulatory Visit | Attending: Cardiovascular Disease | Admitting: Cardiovascular Disease

## 2023-09-25 DIAGNOSIS — R079 Chest pain, unspecified: Secondary | ICD-10-CM | POA: Diagnosis not present

## 2023-09-25 LAB — NM MYOCAR MULTI W/SPECT W/WALL MOTION / EF
Angina Index: 0
Duke Treadmill Score: 8
Estimated workload: 7.4
Exercise duration (min): 7 min
Exercise duration (sec): 53 s
LV dias vol: 92 mL (ref 62–150)
LV sys vol: 25 mL (ref 4.2–5.8)
MPHR: 153 {beats}/min
Nuc Stress EF: 73 %
Peak HR: 131 {beats}/min
Percent HR: 85 %
Rest HR: 72 {beats}/min
Rest Nuclear Isotope Dose: 10.9 mCi
SDS: 1
SRS: 1
SSS: 1
ST Depression (mm): 0 mm
Stress Nuclear Isotope Dose: 31 mCi
TID: 0.9

## 2023-09-25 MED ORDER — TECHNETIUM TC 99M TETROFOSMIN IV KIT
10.8600 | PACK | Freq: Once | INTRAVENOUS | Status: AC | PRN
  Administered 2023-09-25: 10.86 via INTRAVENOUS

## 2023-09-25 MED ORDER — TECHNETIUM TC 99M TETROFOSMIN IV KIT
30.9500 | PACK | Freq: Once | INTRAVENOUS | Status: AC | PRN
  Administered 2023-09-25: 30.95 via INTRAVENOUS

## 2023-09-25 NOTE — Progress Notes (Signed)
     Koren CHRISTELLA Eans presented for a nuclear stress test today.  I Lesley LITTIE Maffucci, PA-C, provided direct supervision and was present during the stress portion of the study today, which was completed without significant symptoms, immediate complications, or acute ST/T changes on ECG.  Stress imaging is pending at this time.  Preliminary ECG findings may be listed in the chart, but the stress test result will not be finalized until perfusion imaging is complete.  Lesley LITTIE Maffucci, PA-C  09/25/2023, 10:55 AM

## 2023-09-27 ENCOUNTER — Ambulatory Visit: Attending: Cardiovascular Disease

## 2023-09-27 DIAGNOSIS — R0602 Shortness of breath: Secondary | ICD-10-CM | POA: Diagnosis not present

## 2023-09-27 LAB — ECHOCARDIOGRAM COMPLETE
AR max vel: 2.92 cm2
AV Area VTI: 3 cm2
AV Area mean vel: 2.79 cm2
AV Mean grad: 2.5 mmHg
AV Peak grad: 5.2 mmHg
Ao pk vel: 1.14 m/s
Area-P 1/2: 3.65 cm2
S' Lateral: 2.6 cm

## 2023-10-15 ENCOUNTER — Ambulatory Visit: Admitting: Cardiology

## 2023-10-26 ENCOUNTER — Encounter: Payer: Self-pay | Admitting: Cardiovascular Disease

## 2023-10-26 ENCOUNTER — Ambulatory Visit: Attending: Cardiovascular Disease | Admitting: Cardiovascular Disease

## 2023-10-26 VITALS — BP 138/74 | HR 81 | Ht 68.0 in | Wt 159.0 lb

## 2023-10-26 DIAGNOSIS — E785 Hyperlipidemia, unspecified: Secondary | ICD-10-CM

## 2023-10-26 DIAGNOSIS — Z136 Encounter for screening for cardiovascular disorders: Secondary | ICD-10-CM

## 2023-10-26 DIAGNOSIS — R0602 Shortness of breath: Secondary | ICD-10-CM | POA: Diagnosis not present

## 2023-10-26 DIAGNOSIS — R079 Chest pain, unspecified: Secondary | ICD-10-CM

## 2023-10-26 DIAGNOSIS — Z720 Tobacco use: Secondary | ICD-10-CM

## 2023-10-26 NOTE — Progress Notes (Signed)
 Cardiology Office Note   Date:  10/26/2023   ID:  Drew Lee, DOB [birth year], MRN [id number]  PCP:  Barbra Odor, NP  Cardiologist:   Deatrice Cage, MD   Chief Complaint  Patient presents with   Follow-up    6 wk f/u. Meds reviewed verbally with pt.      History of Present Illness: Drew Lee is a 68 y.o. male who is here today for a follow-up visit regarding chest pain and shortness of breath.    He has known history of COPD and extensive tobacco use.  He smokes 2 packs/day and has been doing so for more than 50 years. CT cancer screening in May showed evidence of aortic atherosclerosis and three-vessel coronary artery calcifications.  Ascending aorta was mildly dilated at 4 cm.  He was seen recently by pulmonary for abnormal pulmonary nodules.  PET scan was abnormal and thus he was scheduled for bronchoscopy but he canceled due to his concerns about recent chest pain.  The chest pain was after he had an episode of pneumonia.   He underwent a treadmill stress test which showed no evidence of ischemia with normal ejection fraction.  Echocardiogram showed normal LV systolic function with no significant valvular abnormalities.  He reports resolution of chest pain over the last few weeks.  He continues to smoke.   Past Medical History:  Diagnosis Date   Anxiety    Asthma    as child   Bipolar 1 disorder (HCC)    Depression    ED (erectile dysfunction)    ETOH abuse    Hyperlipidemia    Lung nodule 08/15/2023   Pneumonia     Past Surgical History:  Procedure Laterality Date   COLONOSCOPY     TONSILLECTOMY       Current Outpatient Medications  Medication Sig Dispense Refill   albuterol  (PROVENTIL  HFA;VENTOLIN  HFA) 108 (90 BASE) MCG/ACT inhaler Inhale 2 puffs into the lungs every 6 (six) hours as needed for wheezing or shortness of breath.     atorvastatin (LIPITOR) 10 MG tablet Take 10 mg by mouth daily.     cyclobenzaprine  (FEXMID ) 7.5 MG tablet Take 1 tablet  (7.5 mg total) by mouth 2 (two) times daily as needed for muscle spasms. 20 tablet 0   Green Tea, Camillia sinensis, (GREEN TEA EXTRACT) 150 MG CAPS Take 1 capsule by mouth daily.     lamoTRIgine  (LAMICTAL ) 100 MG tablet Take 100 mg by mouth daily.     Multiple Vitamins-Minerals (MULTIVITAMIN WITH MINERALS) tablet Take 1 tablet by mouth daily.     nitroGLYCERIN (NITROSTAT) 0.4 MG SL tablet Place 0.4 mg under the tongue every 5 (five) minutes as needed.     Omega 3 1000 MG CAPS Take 1,000 mg by mouth 2 (two) times daily. (Patient taking differently: Take 1,000 mg by mouth daily.)     Thiamine  HCl (VITAMIN B-1) 250 MG tablet Take 250 mg by mouth daily.     Red Yeast Rice 600 MG CAPS Take by mouth. (Patient not taking: Reported on 10/26/2023)     No current facility-administered medications for this visit.    Allergies:   Magnesium-containing compounds, Sulfa antibiotics, Sulfonamide derivatives, and Viagra [sildenafil citrate]    Social History:  The patient  reports that he has been smoking cigarettes. He has a 67.5 pack-year smoking history. He has quit using smokeless tobacco.  His smokeless tobacco use included chew. He reports current alcohol use of about 4.0 standard  drinks of alcohol per week. He reports that he does not use drugs.   Family History:  The patient's family history includes Heart attack in his maternal grandfather; Kidney failure in his mother.    ROS:  Please see the history of present illness.   Otherwise, review of systems are positive for none.   All other systems are reviewed and negative.    PHYSICAL EXAM: VS:  BP 138/74 (BP Location: Left Arm, Patient Position: Sitting, Cuff Size: Normal)   Pulse 81   Ht 5' 8 (1.727 m)   Wt 159 lb (72.1 kg)   SpO2 98%   BMI 24.18 kg/m  , BMI Body mass index is 24.18 kg/m. GEN: Well nourished, well developed, in no acute distress  HEENT: normal  Neck: no JVD, carotid bruits, or masses Cardiac: RRR; no murmurs, rubs, or  gallops,no edema  Respiratory: Diminished breath sounds. GI: soft, nontender, nondistended, + BS MS: no deformity or atrophy  Skin: warm and dry, no rash Neuro:  Strength and sensation are intact Psych: euthymic mood, full affect   EKG:  EKG is not ordered today.   Recent Labs: No results found for requested labs within last 365 days.    Lipid Panel No results found for: CHOL, TRIG, HDL, CHOLHDL, VLDL, LDLCALC, LDLDIRECT    Wt Readings from Last 3 Encounters:  10/26/23 159 lb (72.1 kg)  09/06/23 156 lb 8 oz (71 kg)  08/17/23 157 lb 9.6 oz (71.5 kg)          09/06/2023   10:28 AM 09/05/2023    5:24 PM  PAD Screen  Previous PAD dx? No No  Previous surgical procedure? No No  Pain with walking? No No  Subsides with rest? No No  Feet/toe relief with dangling? No No  Painful, non-healing ulcers? No No  Extremities discolored? No No      ASSESSMENT AND PLAN:  1.  Chest pain: Likely noncardiac.  Stress test showed no evidence of ischemia.  He reports no recurrent symptoms over the last few weeks. He does have coronary artery disease with evidence of three-vessel coronary artery calcifications by CT scan. Will plan on starting him on low-dose aspirin once he is done with bronchoscopy and lung biopsy.  Recommend treatment of risk factors.  2.  Shortness of breath: Likely due to underlying COPD .  Echocardiogram was overall unremarkable.  3.  Hyperlipidemia: He is currently on atorvastatin 10 mg daily.  I requested lipid and liver profile and will likely need to uptitrate the medication to achieve an LDL below 55.  4.  Tobacco use: I discussed with him the importance of smoking cessation.  5.  AAA screening: Given his age and smoking status, I requested an ultrasound.  6.  Preop cardiovascular evaluation for bronchoscopy and lung biopsy: He should be at low risk from a cardiac standpoint.  I advised him to reach out to Dr. Isadora to reschedule the  procedure.    Disposition:   FU in 6 months.  Signed,  Deatrice Cage, MD  10/26/2023 9:21 AM    [facility]

## 2023-10-26 NOTE — Patient Instructions (Signed)
 Medication Instructions:  No changes *If you need a refill on your cardiac medications before your next appointment, please call your pharmacy*  Lab Work: Your provider would like for you to have the following labs today: Lipid and Liver  If you have labs (blood work) drawn today and your tests are completely normal, you will receive your results only by: MyChart Message (if you have MyChart) OR A paper copy in the mail If you have any lab test that is abnormal or we need to change your treatment, we will call you to review the results.  Testing/Procedures: Your physician has requested that you have an abdominal aorta duplex. During this test, an ultrasound is used to evaluate the aorta. Allow 30 minutes for this exam. Do not eat after midnight the day before and avoid carbonated beverages. This will take place at [address] ([facility] [address], [place] [address]   Follow-Up: At [facility], you and your health needs are our priority.  As part of our continuing mission to provide you with exceptional heart care, our providers are all part of one team.  This team includes your primary Cardiologist (physician) and Advanced Practice Providers or APPs (Physician Assistants and Nurse Practitioners) who all work together to provide you with the care you need, when you need it.  Your next appointment:   6 month(s)  Provider:   You may see Dr. Darron or one of the following Advanced Practice Providers on your designated Care Team:   Lonni Meager, NP Lesley Maffucci, PA-C Bernardino Bring, PA-C Cadence [place], PA-C Tylene Lunch, NP Barnie Hila, NP    We recommend signing up for the patient portal called MyChart.  Sign up information is provided on this After Visit Summary.  MyChart is used to connect with patients for Virtual Visits (Telemedicine).  Patients are able to view lab/test results, encounter notes, upcoming appointments, etc.  Non-urgent messages can be  sent to your provider as well.   To learn more about what you can do with MyChart, go to [email or web address].   Managing the Challenge of Quitting Smoking Quitting smoking is a physical and mental challenge. You may have cravings, withdrawal symptoms, and temptation to smoke. Before quitting, work with your health care provider to make a plan that can help you manage quitting. Making a plan before you quit may keep you from smoking when you have the urge to smoke while trying to quit. How to manage lifestyle changes Managing stress Stress can make you want to smoke, and wanting to smoke may cause stress. It is important to find ways to manage your stress. You could try some of the following: Practice relaxation techniques. Breathe slowly and deeply, in through your nose and out through your mouth. Listen to music. Soak in a bath or take a shower. Imagine a peaceful place or vacation. Get some support. Talk with family or friends about your stress. Join a support group. Talk with a counselor or therapist. Get some physical activity. Go for a walk, run, or bike ride. Play a favorite sport. Practice yoga.  Medicines Talk with your health care provider about medicines that might help you deal with cravings and make quitting easier for you. Relationships Social situations can be difficult when you are quitting smoking. To manage this, you can: Avoid parties and other social situations where people might be smoking. Avoid alcohol. Leave right away if you have the urge to smoke. Explain to your family and friends that you  are quitting smoking. Ask for support and let them know you might be a bit grumpy. Plan activities where smoking is not an option. General instructions Be aware that many people gain weight after they quit smoking. However, not everyone does. To keep from gaining weight, have a plan in place before you quit, and stick to the plan after you quit. Your plan should  include: Eating healthy snacks. When you have a craving, it may help to: Eat popcorn, or try carrots, celery, or other cut vegetables. Chew sugar-free gum. Changing how you eat. Eat small portion sizes at meals. Eat 4-6 small meals throughout the day instead of 1-2 large meals a day. Be mindful when you eat. You should avoid watching television or doing other things that might distract you as you eat. Exercising regularly. Make time to exercise each day. If you do not have time for a long workout, do short bouts of exercise for 5-10 minutes several times a day. Do some form of strengthening exercise, such as weight lifting. Do some exercise that gets your heart beating and causes you to breathe deeply, such as walking fast, running, swimming, or biking. This is very important. Drinking plenty of water or other low-calorie or no-calorie drinks. Drink enough fluid to keep your urine pale yellow.  How to recognize withdrawal symptoms Your body and mind may experience discomfort as you try to get used to not having nicotine  in your system. These effects are called withdrawal symptoms. They may include: Feeling hungrier than normal. Having trouble concentrating. Feeling irritable or restless. Having trouble sleeping. Feeling depressed. Craving a cigarette. These symptoms may surprise you, but they are normal to have when quitting smoking. To manage withdrawal symptoms: Avoid places, people, and activities that trigger your cravings. Remember why you want to quit. Get plenty of sleep. Avoid coffee and other drinks that contain caffeine. These may worsen some of your symptoms. How to manage cravings Come up with a plan for how to deal with your cravings. The plan should include the following: A definition of the specific situation you want to deal with. An activity or action you will take to replace smoking. A clear idea for how this action will help. The name of someone who could help you  with this. Cravings usually last for 5-10 minutes. Consider taking the following actions to help you with your plan to deal with cravings: Keep your mouth busy. Chew sugar-free gum. Suck on hard candies or a straw. Brush your teeth. Keep your hands and body busy. Change to a different activity right away. Squeeze or play with a ball. Do an activity or a hobby, such as making bead jewelry, practicing needlepoint, or working with wood. Mix up your normal routine. Take a short exercise break. Go for a quick walk, or run up and down stairs. Focus on doing something kind or helpful for someone else. Call a friend or family member to talk during a craving. Join a support group. Contact a quitline. Where to find support To get help or find a support group: Call the National Cancer Institute's Smoking Quitline: 1-800-QUIT-NOW 2103488897) Text QUIT to SmokefreeTXT: 521151 Where to find more information Visit these websites to find more information on quitting smoking: U.S. Department of Health and Human Services: www.smokefree.gov American Lung Association: www.freedomfromsmoking.org Centers for Disease Control and Prevention (CDC): FootballExhibition.com.br American Heart Association: www.heart.org Contact a health care provider if: You want to change your plan for quitting. The medicines you are taking are not  helping. Your eating feels out of control or you cannot sleep. You feel depressed or become very anxious. Summary Quitting smoking is a physical and mental challenge. You will face cravings, withdrawal symptoms, and temptation to smoke again. Preparation can help you as you go through these challenges. Try different techniques to manage stress, handle social situations, and prevent weight gain. You can deal with cravings by keeping your mouth busy (such as by chewing gum), keeping your hands and body busy, calling family or friends, or contacting a quitline for people who want to quit smoking. You  can deal with withdrawal symptoms by avoiding places where people smoke, getting plenty of rest, and avoiding drinks that contain caffeine. This information is not intended to replace advice given to you by your health care provider. Make sure you discuss any questions you have with your health care provider. Document Revised: 02/11/2021 Document Reviewed: 02/11/2021 Elsevier Patient Education  2024 [employer].

## 2023-10-27 LAB — LIPID PANEL
Chol/HDL Ratio: 2.4 ratio (ref 0.0–5.0)
Cholesterol, Total: 135 mg/dL (ref 100–199)
HDL: 57 mg/dL (ref >39)
LDL Chol Calc (NIH): 69 mg/dL (ref 0–99)
Triglycerides: 36 mg/dL (ref 0–149)
VLDL Cholesterol Cal: 9 mg/dL (ref 5–40)

## 2023-10-27 LAB — HEPATIC FUNCTION PANEL
ALT: 26 IU/L (ref 0–44)
AST: 27 IU/L (ref 0–40)
Albumin: 4.3 g/dL (ref 3.9–4.9)
Alkaline Phosphatase: 65 IU/L (ref 44–121)
Bilirubin Total: 0.4 mg/dL (ref 0.0–1.2)
Bilirubin, Direct: 0.17 mg/dL (ref 0.00–0.40)
Total Protein: 6.6 g/dL (ref 6.0–8.5)

## 2023-10-29 ENCOUNTER — Ambulatory Visit: Payer: Self-pay | Admitting: Cardiovascular Disease

## 2023-10-29 ENCOUNTER — Telehealth: Payer: Self-pay | Admitting: Cardiovascular Disease

## 2023-10-29 MED ORDER — ATORVASTATIN CALCIUM 20 MG PO TABS: 20.0000 mg | ORAL_TABLET | Freq: Every day | ORAL | 3 refills | Status: AC

## 2023-10-29 NOTE — Telephone Encounter (Signed)
 Patient was returning call. Pease advise

## 2023-10-29 NOTE — Telephone Encounter (Signed)
 Spoke with patient and reviewed results and recommendations. He verbalized understanding with no questions.    Darron Deatrice LABOR, MD to Estelle Olam GRADE, RN   10/29/23  9:23 AM Result Note Inform patient that labs are normal. Cholesterol is reasonable but his LDL should be lowered further.  Recommend increasing atorvastatin  to 20 mg daily.

## 2023-11-07 ENCOUNTER — Telehealth: Payer: Self-pay

## 2023-11-07 NOTE — Telephone Encounter (Signed)
-----   Message from [place] D[place]li sent at 11/07/2023  1:44 PM EDT ----- Patient has been evaluated by cardiology for CAD and cleared. Can you reach out to him to reschedule the lung nodule evaluation.  Thanks [employer] ----- Message ----- From: Darron Deatrice LABOR, MD Sent: 10/26/2023   9:48 AM EDT To: Belva November, MD

## 2023-11-07 NOTE — Telephone Encounter (Signed)
 Lm x1 for the patient.

## 2023-11-08 NOTE — Telephone Encounter (Signed)
 Lm x2 for the patient and I have mailed him a letter.  Closing this encounter per office protocol.

## 2023-11-09 NOTE — Telephone Encounter (Signed)
 CRM # [id number] Owner: None Status: Unresolved Open  Priority: Routine Created on: 11/08/2023 05:59 PM By: Jestine Rozanna HERO   Primary Information  Source  Drew Lee HERO (Patient)   Subject  Drew Lee HERO (Patient)   Topic  Clinical - Medication Question    Communication  Reason for CRM: pt wouldl like to put the lung elevation on hold for now, you can reach back if needed any other questions

## 2023-11-09 NOTE — Telephone Encounter (Signed)
 Noted. Nothing further needed.

## 2023-12-05 ENCOUNTER — Other Ambulatory Visit

## 2024-01-02 ENCOUNTER — Ambulatory Visit: Attending: Cardiovascular Disease

## 2024-01-02 DIAGNOSIS — Z136 Encounter for screening for cardiovascular disorders: Secondary | ICD-10-CM | POA: Diagnosis not present
